# Patient Record
Sex: Male | Born: 1960 | Hispanic: Yes | Marital: Married | Smoking: Never smoker
Health system: Southern US, Community
[De-identification: ages and names within clinical notes are randomized; demographics above are authoritative.]

## PROBLEM LIST (undated history)

## (undated) DIAGNOSIS — E119 Type 2 diabetes mellitus without complications: Secondary | ICD-10-CM

---

## 2021-02-01 ENCOUNTER — Encounter (HOSPITAL_COMMUNITY): Payer: Self-pay | Admitting: Family Medicine

## 2021-02-01 ENCOUNTER — Inpatient Hospital Stay (HOSPITAL_COMMUNITY): Payer: Managed Care, Other (non HMO)

## 2021-02-01 ENCOUNTER — Inpatient Hospital Stay (HOSPITAL_COMMUNITY)
Admission: AD | Admit: 2021-02-01 | Discharge: 2021-02-08 | DRG: 441 | Disposition: A | Discharge status: Home Health | Payer: Managed Care, Other (non HMO) | Source: Other Acute Inpatient Hospital | Attending: Internal Medicine | Admitting: Internal Medicine

## 2021-02-01 DIAGNOSIS — R188 Other ascites: Secondary | ICD-10-CM | POA: Diagnosis present

## 2021-02-01 DIAGNOSIS — Z79899 Other long term (current) drug therapy: Secondary | ICD-10-CM | POA: Diagnosis not present

## 2021-02-01 DIAGNOSIS — R7881 Bacteremia: Secondary | ICD-10-CM | POA: Diagnosis present

## 2021-02-01 DIAGNOSIS — B9620 Unspecified Escherichia coli [E. coli] as the cause of diseases classified elsewhere: Secondary | ICD-10-CM | POA: Diagnosis present

## 2021-02-01 DIAGNOSIS — B9689 Other specified bacterial agents as the cause of diseases classified elsewhere: Secondary | ICD-10-CM | POA: Diagnosis not present

## 2021-02-01 DIAGNOSIS — E1165 Type 2 diabetes mellitus with hyperglycemia: Secondary | ICD-10-CM | POA: Diagnosis present

## 2021-02-01 DIAGNOSIS — E8809 Other disorders of plasma-protein metabolism, not elsewhere classified: Secondary | ICD-10-CM | POA: Diagnosis present

## 2021-02-01 DIAGNOSIS — Z95828 Presence of other vascular implants and grafts: Secondary | ICD-10-CM

## 2021-02-01 DIAGNOSIS — A419 Sepsis, unspecified organism: Secondary | ICD-10-CM | POA: Diagnosis present

## 2021-02-01 DIAGNOSIS — D649 Anemia, unspecified: Secondary | ICD-10-CM | POA: Diagnosis present

## 2021-02-01 DIAGNOSIS — R6520 Severe sepsis without septic shock: Secondary | ICD-10-CM | POA: Diagnosis present

## 2021-02-01 DIAGNOSIS — Z7984 Long term (current) use of oral hypoglycemic drugs: Secondary | ICD-10-CM | POA: Diagnosis not present

## 2021-02-01 DIAGNOSIS — Z833 Family history of diabetes mellitus: Secondary | ICD-10-CM | POA: Diagnosis not present

## 2021-02-01 DIAGNOSIS — K750 Abscess of liver: Principal | ICD-10-CM | POA: Diagnosis present

## 2021-02-01 DIAGNOSIS — R197 Diarrhea, unspecified: Secondary | ICD-10-CM | POA: Diagnosis present

## 2021-02-01 DIAGNOSIS — K651 Peritoneal abscess: Secondary | ICD-10-CM | POA: Diagnosis present

## 2021-02-01 DIAGNOSIS — N179 Acute kidney failure, unspecified: Secondary | ICD-10-CM | POA: Diagnosis present

## 2021-02-01 DIAGNOSIS — B966 Bacteroides fragilis [B. fragilis] as the cause of diseases classified elsewhere: Secondary | ICD-10-CM | POA: Diagnosis present

## 2021-02-01 DIAGNOSIS — E119 Type 2 diabetes mellitus without complications: Secondary | ICD-10-CM | POA: Diagnosis not present

## 2021-02-01 DIAGNOSIS — E876 Hypokalemia: Secondary | ICD-10-CM | POA: Diagnosis present

## 2021-02-01 DIAGNOSIS — Z789 Other specified health status: Secondary | ICD-10-CM

## 2021-02-01 DIAGNOSIS — Z978 Presence of other specified devices: Secondary | ICD-10-CM | POA: Diagnosis not present

## 2021-02-01 DIAGNOSIS — R9431 Abnormal electrocardiogram [ECG] [EKG]: Secondary | ICD-10-CM | POA: Diagnosis not present

## 2021-02-01 HISTORY — DX: Type 2 diabetes mellitus without complications: E11.9

## 2021-02-01 LAB — GLUCOSE, CAPILLARY
Glucose-Capillary: 131 mg/dL — ABNORMAL HIGH (ref 70–99)
Glucose-Capillary: 173 mg/dL — ABNORMAL HIGH (ref 70–99)

## 2021-02-01 MED ORDER — OXYCODONE HCL 5 MG PO TABS
5.0000 mg | ORAL_TABLET | ORAL | Status: DC | PRN
  Administered 2021-02-01 – 2021-02-08 (×17): 5 mg via ORAL
  Filled 2021-02-01 (×17): qty 1

## 2021-02-01 MED ORDER — SODIUM CHLORIDE 0.9% FLUSH
10.0000 mL | INTRAVENOUS | Status: DC | PRN
  Administered 2021-02-07: 10 mL

## 2021-02-01 MED ORDER — ONDANSETRON HCL 4 MG/2ML IJ SOLN: 4.0000 mg | Freq: Four times a day (QID) | INTRAMUSCULAR | Status: DC | PRN

## 2021-02-01 MED ORDER — ACETAMINOPHEN 650 MG RE SUPP: 650.0000 mg | Freq: Four times a day (QID) | RECTAL | Status: DC | PRN

## 2021-02-01 MED ORDER — SODIUM CHLORIDE 0.9 % IV SOLN
1.0000 g | Freq: Three times a day (TID) | INTRAVENOUS | Status: DC
  Administered 2021-02-01 – 2021-02-06 (×15): 1 g via INTRAVENOUS
  Filled 2021-02-01 (×17): qty 1

## 2021-02-01 MED ORDER — POLYETHYLENE GLYCOL 3350 17 G PO PACK: 17.0000 g | PACK | Freq: Every day | ORAL | Status: DC | PRN

## 2021-02-01 MED ORDER — SODIUM CHLORIDE 0.9% FLUSH
10.0000 mL | Freq: Two times a day (BID) | INTRAVENOUS | Status: DC
  Administered 2021-02-01 – 2021-02-02 (×2): 10 mL
  Administered 2021-02-02 – 2021-02-03 (×2): 30 mL
  Administered 2021-02-03 – 2021-02-08 (×10): 10 mL

## 2021-02-01 MED ORDER — ONDANSETRON HCL 4 MG PO TABS: 4.0000 mg | ORAL_TABLET | Freq: Four times a day (QID) | ORAL | Status: DC | PRN

## 2021-02-01 MED ORDER — INSULIN ASPART 100 UNIT/ML ~~LOC~~ SOLN
0.0000 [IU] | SUBCUTANEOUS | Status: DC
  Administered 2021-02-01: 3 [IU] via SUBCUTANEOUS
  Administered 2021-02-02: 5 [IU] via SUBCUTANEOUS
  Administered 2021-02-02: 3 [IU] via SUBCUTANEOUS
  Administered 2021-02-02 (×2): 2 [IU] via SUBCUTANEOUS
  Administered 2021-02-03 (×2): 3 [IU] via SUBCUTANEOUS
  Administered 2021-02-03: 5 [IU] via SUBCUTANEOUS
  Administered 2021-02-03 – 2021-02-04 (×4): 3 [IU] via SUBCUTANEOUS
  Administered 2021-02-04: 2 [IU] via SUBCUTANEOUS
  Administered 2021-02-04 (×3): 3 [IU] via SUBCUTANEOUS
  Administered 2021-02-05 (×3): 2 [IU] via SUBCUTANEOUS
  Administered 2021-02-05: 5 [IU] via SUBCUTANEOUS
  Administered 2021-02-05 (×2): 2 [IU] via SUBCUTANEOUS
  Administered 2021-02-06: 3 [IU] via SUBCUTANEOUS
  Administered 2021-02-06 – 2021-02-07 (×5): 2 [IU] via SUBCUTANEOUS
  Administered 2021-02-07: 3 [IU] via SUBCUTANEOUS
  Administered 2021-02-07 – 2021-02-08 (×3): 2 [IU] via SUBCUTANEOUS

## 2021-02-01 MED ORDER — HYDROMORPHONE HCL 1 MG/ML IJ SOLN
0.5000 mg | INTRAMUSCULAR | Status: DC | PRN
  Administered 2021-02-02 – 2021-02-04 (×4): 0.5 mg via INTRAVENOUS
  Filled 2021-02-01 (×4): qty 1

## 2021-02-01 MED ORDER — CHLORHEXIDINE GLUCONATE CLOTH 2 % EX PADS
6.0000 | MEDICATED_PAD | Freq: Every day | CUTANEOUS | Status: DC
  Administered 2021-02-03 – 2021-02-08 (×5): 6 via TOPICAL

## 2021-02-01 MED ORDER — INSULIN GLARGINE 100 UNIT/ML ~~LOC~~ SOLN
5.0000 [IU] | Freq: Every day | SUBCUTANEOUS | Status: DC
  Administered 2021-02-01 – 2021-02-02 (×2): 5 [IU] via SUBCUTANEOUS
  Filled 2021-02-01 (×3): qty 0.05

## 2021-02-01 MED ORDER — ACETAMINOPHEN 325 MG PO TABS: 650.0000 mg | ORAL_TABLET | Freq: Four times a day (QID) | ORAL | Status: DC | PRN

## 2021-02-01 MED ORDER — SODIUM CHLORIDE 0.9 % IV SOLN: INTRAVENOUS | Status: AC

## 2021-02-01 NOTE — H&P (Signed)
History and Physical    Maurice Hodges KPT:[id number] DOB: 03-15-1961 DOA: 02/01/2021  PCP: [facility]   Patient coming from: [facility], had been at home prior   Chief Complaint: Abdominal pain, chills   HPI: Maurice Hodges is a 60 y.o. male with medical history significant for type 2 diabetes mellitus, presented to urgent care on 01/26/2020 after he developed chills and abdominal discomfort, was found to have glucose greater than 600, and was directed to the ED.  He had been taking metformin only for diabetes, had been experiencing some abdominal discomfort that he described as mild and generalized, but became concerned when he developed chills and some fatigue.  [facility] Course: Upon arrival to the ED on 01/25/2021, patient was found to be afebrile, hemodynamically stable initially but later developed systolic pressures in the 70s and 80s that responded to IV fluid boluses.  He had serum glucose of 706, creatinine 1.40, albumin 2.0, AST 188, and ALT 200 with normal bilirubin.  CT revealed large hepatic abscess.  Percutaneous drain was placed by IR on 01/27/2021.  Cultures from the abscess grew E. coli and Bacteroides fragilis.  Blood culture grew Bacteroides.  He had been on Zosyn, developed tubular hepatic abscesses, and was reportedly switched over to meropenem prior to transfer to [facility].  Review of Systems:  All other systems reviewed and apart from HPI, are negative.  Past Medical History:  Diagnosis Date  . Diabetes mellitus without complication (HCC)     History reviewed. No pertinent surgical history.  Social History:   reports that he has never smoked. He has never used smokeless tobacco. He reports previous alcohol use. He reports that he does not use drugs.  Not on File  Family History  Problem Relation Age of Onset  . Diabetes Other   . Heart attack Neg Hx   . Stroke Neg Hx      Prior to Admission medications   Not on File     Physical Exam: Vitals:   02/01/21 1848 02/01/21 2036  BP: 130/82 134/82  Pulse: (!) 104 (!) 103  Resp:  20  Temp: 98.8 F (37.1 C) 98.5 F (36.9 C)  TempSrc: Oral Oral  SpO2: 97% 96%    Constitutional: NAD, calm  Eyes: PERTLA, lids and conjunctivae normal ENMT: Mucous membranes are moist. Posterior pharynx clear of any exudate or lesions.   Neck: supple, no masses  Respiratory:  no wheezing, no crackles. No accessory muscle use.  Cardiovascular: S1 & S2 heard, regular rate and rhythm. Pitting edema lower legs bilaterally.   Abdomen: soft, tender in mid abdomen and RUQ without rebound tenderness or guarding. Bowel sounds active.  Musculoskeletal: no clubbing / cyanosis. No joint deformity upper and lower extremities.   Skin: no significant rashes, lesions, ulcers. Warm, dry, well-perfused. Neurologic: CN 2-12 grossly intact. Sensation intact. Moving all extremities.  Psychiatric: Alert and oriented to person, place, and situation. Very pleasant and cooperative.    Labs and Imaging on Admission: I have personally reviewed following labs and imaging studies  CBC: No results for input(s): WBC, NEUTROABS, HGB, HCT, MCV, PLT in the last 168 hours. Basic Metabolic Panel: No results for input(s): NA, K, CL, CO2, GLUCOSE, BUN, CREATININE, CALCIUM, MG, PHOS in the last 168 hours. GFR: CrCl cannot be calculated (No successful lab value found.). Liver Function Tests: No results for input(s): AST, ALT, ALKPHOS, BILITOT, PROT, ALBUMIN in the last 168 hours. No results for input(s): LIPASE, AMYLASE in the  last 168 hours. No results for input(s): AMMONIA in the last 168 hours. Coagulation Profile: No results for input(s): INR, PROTIME in the last 168 hours. Cardiac Enzymes: No results for input(s): CKTOTAL, CKMB, CKMBINDEX, TROPONINI in the last 168 hours. BNP (last 3 results) No results for input(s): PROBNP in the last 8760 hours. HbA1C: No results for input(s): HGBA1C in the last  72 hours. CBG: Recent Labs  Lab 02/01/21 1852  GLUCAP 173*   Lipid Profile: No results for input(s): CHOL, HDL, LDLCALC, TRIG, CHOLHDL, LDLDIRECT in the last 72 hours. Thyroid Function Tests: No results for input(s): TSH, T4TOTAL, FREET4, T3FREE, THYROIDAB in the last 72 hours. Anemia Panel: No results for input(s): VITAMINB12, FOLATE, FERRITIN, TIBC, IRON, RETICCTPCT in the last 72 hours. Urine analysis: No results found for: COLORURINE, APPEARANCEUR, LABSPEC, PHURINE, GLUCOSEU, HGBUR, BILIRUBINUR, KETONESUR, PROTEINUR, UROBILINOGEN, NITRITE, LEUKOCYTESUR Sepsis Labs: @LABRCNTIP (procalcitonin:4,lacticidven:4) )No results found for this or any previous visit (from the past 240 hour(s)).   Radiological Exams on Admission: No results found.   Assessment/Plan   1. Liver abscesses; bacteremia   - Admitted to [facility] 4/13 with sepsis secondary to liver abscess, percutaneous drain placed 4/15, blood culture growing Bacteroides fragilis, was on Zosyn, developed 2 more liver abscesses, now on meropenem and transferred to [facility]  - Sepsis appears to have resolved  - Continue meropenem, consult IR for percutaneous drainage of new abscesses, follow cultures and clinical course    2. Type II DM  - A1c was 14.0% on 4/13, had been on metformin only prior to admission to [facility] where he has been managed with Lantus and Humalog  - Continue CBG checks, Lantus, Novolog sliding-scale    3. Anemia  - Hgb 9.4 on day of transfer (4/20), had been 10.8 on admission (4/13)  - No overt bleeding  - Monitor, transfuse if needed     4. Hypokalemia  - Potassium 3.4, was being replaced at [facility]   - Repeat chem panel in am    5. Severe sepsis; AKI  - Appears to have resolved prior to transfer  - Had SCr 1.4 on admission to [facility], down to 0.59 today - Continue antibiotics, repeat labs    DVT prophylaxis: SCDs Code Status: Full  Level of Care: Level of care: Med-Surg Family Communication: Brother updated  at bedside Disposition Plan:  Patient is from: Home  Anticipated d/c is to: Home  Anticipated d/c date is: 02/05/21 Patient currently: Pending IR eval for drain placement, culture sensitivities  Consults called: None Admission status: Inpatient     02/07/21, MD [facility]  02/01/2021, 8:52 PM

## 2021-02-01 NOTE — Progress Notes (Signed)
Pt on unit.  CBG 173.  Pain 8/10.  Wears glasses. No missing teeth, dentures or partials.  PICC triple lumen, right arm  No skin issues.  Last BM 02/01/2021. Diarrhea. Negative for C. Diff  Pulses moderate in all extremities.  Abdomen distended, RUQ pain and pain along crease under umbilicus.  Pt has been NPO since right after breakfast 04/20 d/t not knowing the plan for when he arrived here at [facility].  Messaged Dr. Katrinka Blazing for orders for pain medication.

## 2021-02-02 ENCOUNTER — Inpatient Hospital Stay (HOSPITAL_COMMUNITY): Payer: Managed Care, Other (non HMO)

## 2021-02-02 DIAGNOSIS — N179 Acute kidney failure, unspecified: Secondary | ICD-10-CM

## 2021-02-02 DIAGNOSIS — Z978 Presence of other specified devices: Secondary | ICD-10-CM

## 2021-02-02 DIAGNOSIS — A419 Sepsis, unspecified organism: Secondary | ICD-10-CM

## 2021-02-02 DIAGNOSIS — E876 Hypokalemia: Secondary | ICD-10-CM

## 2021-02-02 DIAGNOSIS — R6520 Severe sepsis without septic shock: Secondary | ICD-10-CM

## 2021-02-02 DIAGNOSIS — D649 Anemia, unspecified: Secondary | ICD-10-CM

## 2021-02-02 DIAGNOSIS — B9689 Other specified bacterial agents as the cause of diseases classified elsewhere: Secondary | ICD-10-CM

## 2021-02-02 DIAGNOSIS — B9620 Unspecified Escherichia coli [E. coli] as the cause of diseases classified elsewhere: Secondary | ICD-10-CM

## 2021-02-02 DIAGNOSIS — K750 Abscess of liver: Principal | ICD-10-CM

## 2021-02-02 LAB — CBC WITH DIFFERENTIAL/PLATELET
Abs Immature Granulocytes: 0.19 10*3/uL — ABNORMAL HIGH (ref 0.00–0.07)
Basophils Absolute: 0 10*3/uL (ref 0.0–0.1)
Basophils Relative: 0 %
Eosinophils Absolute: 0.1 10*3/uL (ref 0.0–0.5)
Eosinophils Relative: 1 %
HCT: 30 % — ABNORMAL LOW (ref 39.0–52.0)
Hemoglobin: 9.6 g/dL — ABNORMAL LOW (ref 13.0–17.0)
Immature Granulocytes: 2 %
Lymphocytes Relative: 10 %
Lymphs Abs: 1.2 10*3/uL (ref 0.7–4.0)
MCH: 27.3 pg (ref 26.0–34.0)
MCHC: 32 g/dL (ref 30.0–36.0)
MCV: 85.2 fL (ref 80.0–100.0)
Monocytes Absolute: 0.7 10*3/uL (ref 0.1–1.0)
Monocytes Relative: 6 %
Neutro Abs: 9.9 10*3/uL — ABNORMAL HIGH (ref 1.7–7.7)
Neutrophils Relative %: 81 %
Platelets: 466 10*3/uL — ABNORMAL HIGH (ref 150–400)
RBC: 3.52 MIL/uL — ABNORMAL LOW (ref 4.22–5.81)
RDW: 14.6 % (ref 11.5–15.5)
WBC: 12 10*3/uL — ABNORMAL HIGH (ref 4.0–10.5)
nRBC: 0 % (ref 0.0–0.2)

## 2021-02-02 LAB — COMPREHENSIVE METABOLIC PANEL
ALT: 23 U/L (ref 0–44)
AST: 16 U/L (ref 15–41)
Albumin: 1.2 g/dL — ABNORMAL LOW (ref 3.5–5.0)
Alkaline Phosphatase: 94 U/L (ref 38–126)
Anion gap: 7 (ref 5–15)
BUN: 6 mg/dL (ref 6–20)
CO2: 27 mmol/L (ref 22–32)
Calcium: 7.6 mg/dL — ABNORMAL LOW (ref 8.9–10.3)
Chloride: 99 mmol/L (ref 98–111)
Creatinine, Ser: 0.57 mg/dL — ABNORMAL LOW (ref 0.61–1.24)
GFR, Estimated: >60 mL/min (ref >60)
Glucose, Bld: 122 mg/dL — ABNORMAL HIGH (ref 70–99)
Potassium: 3.1 mmol/L — ABNORMAL LOW (ref 3.5–5.1)
Sodium: 133 mmol/L — ABNORMAL LOW (ref 135–145)
Total Bilirubin: 0.6 mg/dL (ref 0.3–1.2)
Total Protein: 5.1 g/dL — ABNORMAL LOW (ref 6.5–8.1)

## 2021-02-02 LAB — HIV ANTIBODY (ROUTINE TESTING W REFLEX): HIV Screen 4th Generation wRfx: NONREACTIVE

## 2021-02-02 LAB — GLUCOSE, CAPILLARY
Glucose-Capillary: 111 mg/dL — ABNORMAL HIGH (ref 70–99)
Glucose-Capillary: 127 mg/dL — ABNORMAL HIGH (ref 70–99)
Glucose-Capillary: 137 mg/dL — ABNORMAL HIGH (ref 70–99)
Glucose-Capillary: 172 mg/dL — ABNORMAL HIGH (ref 70–99)
Glucose-Capillary: 194 mg/dL — ABNORMAL HIGH (ref 70–99)
Glucose-Capillary: 227 mg/dL — ABNORMAL HIGH (ref 70–99)

## 2021-02-02 MED ORDER — POTASSIUM CHLORIDE CRYS ER 20 MEQ PO TBCR
20.0000 meq | EXTENDED_RELEASE_TABLET | Freq: Once | ORAL | Status: AC
  Administered 2021-02-02: 20 meq via ORAL
  Filled 2021-02-02: qty 1

## 2021-02-02 MED ORDER — MIDAZOLAM HCL 2 MG/2ML IJ SOLN
INTRAMUSCULAR | Status: AC | PRN
  Administered 2021-02-02 (×4): 1 mg via INTRAVENOUS

## 2021-02-02 MED ORDER — SODIUM CHLORIDE 0.9% FLUSH
5.0000 mL | Freq: Three times a day (TID) | INTRAVENOUS | Status: DC
  Administered 2021-02-02 – 2021-02-08 (×18): 5 mL

## 2021-02-02 MED ORDER — MIDAZOLAM HCL 2 MG/2ML IJ SOLN
INTRAMUSCULAR | Status: AC
  Filled 2021-02-02: qty 8

## 2021-02-02 MED ORDER — MAGNESIUM SULFATE IN D5W 1-5 GM/100ML-% IV SOLN
1.0000 g | Freq: Once | INTRAVENOUS | Status: AC
  Administered 2021-02-02: 1 g via INTRAVENOUS
  Filled 2021-02-02: qty 100

## 2021-02-02 MED ORDER — FENTANYL CITRATE (PF) 100 MCG/2ML IJ SOLN
INTRAMUSCULAR | Status: AC
  Filled 2021-02-02: qty 4

## 2021-02-02 MED ORDER — LIDOCAINE HCL 1 % IJ SOLN
INTRAMUSCULAR | Status: AC
  Filled 2021-02-02: qty 40

## 2021-02-02 MED ORDER — FENTANYL CITRATE (PF) 100 MCG/2ML IJ SOLN
INTRAMUSCULAR | Status: AC | PRN
  Administered 2021-02-02 (×4): 50 ug via INTRAVENOUS

## 2021-02-02 MED ORDER — POTASSIUM CHLORIDE 10 MEQ/100ML IV SOLN
10.0000 meq | INTRAVENOUS | Status: AC
  Administered 2021-02-02 (×2): 10 meq via INTRAVENOUS
  Filled 2021-02-02 (×2): qty 100

## 2021-02-02 NOTE — Procedures (Signed)
Interventional Radiology Procedure:   Indications: Liver abscess with new intra-abdominal fluid collections  Procedure: CT guided placement of 2 drains in right abdomen  Findings: 1) Loculated collection around inferior right hepatic lobe, placed 10 Fr drain and removed 50 ml of cloudy yellow fluid. 2) Large fluid collection in lower abdomen that connects with pelvic collection.  325 ml of cloudy yellow fluid removed and 10 Fr drain placed 3) Existing right hepatic drain with residual low density areas in liver  Complications: None     EBL: less than 10 ml  Plan: Follow output.  Sent fluid for culture. Recommend CT AP with IV contrast in 2-3 days to evaluate liver and abdominal collections.    Maurice Hodges R. Lowella Dandy, MD  Pager: [phone]

## 2021-02-02 NOTE — Consult Note (Signed)
Date of Admission:  02/01/2021          Reason for Consult: Liver abscesses with bacteremia with Bacteroides fragilis   Referring Provider: Dr. Pola Corn   Assessment:  1. Polymicrobial liver abscesses status post placement of drain at [facility] complicated by 2. Bacteroides bacteremia 3. Septic physiology when his drain was closed shut and now found to have new liver abscesses status post 4. IR guided placement of 2 new drains into hepatic abscesses 5. Left peripheral IV not functioning properly  Plan:  1. Continue meropenem for now 2. Follow-up cultures from [facility] which apparently have been sent to the [facility] (per Sharin Mons our Pharm D) 3. Follow-up our own cultures 4. He will need a protracted course of antibiotics and hence the importance of following up with susceptibility data it would be nicer to eventually build to transition him to oral therapy in a rational way. 5. Screen for viral hepatitides and HIV if not done so yet 6. Would DC left peripheral IV  Principal Problem:   Bacterial liver abscess Active Problems:   Uncontrolled type II diabetes mellitus (HCC)   Hypokalemia   AKI (acute kidney injury) (HCC)   Severe sepsis (HCC)   Normocytic anemia   Liver abscess due to bacteria   Scheduled Meds: . Chlorhexidine Gluconate Cloth  6 each Topical Daily  . insulin aspart  0-15 Units Subcutaneous Q4H  . insulin glargine  5 Units Subcutaneous QHS  . lidocaine      . sodium chloride flush  10-40 mL Intracatheter Q12H   Continuous Infusions: . meropenem (MERREM) IV 1 g (02/02/21 1416)   PRN Meds:.acetaminophen **OR** acetaminophen, HYDROmorphone (DILAUDID) injection, ondansetron **OR** ondansetron (ZOFRAN) IV, oxyCODONE, polyethylene glycol, sodium chloride flush  HPI: Maurice Hodges is a 60 y.o. male history of diabetes mellitus who has been feeling poorly going back to the end of March.  He was having fevers chills and malaise.  He was  seen at urgent care at [facility] who diagnosed him with having strep throat based on a rapid strep antigen test.  Note the documentation in the note though Ebony Cargo states that his oropharynx was clear.  The patient self tell me that his tongue had developed a white coating he remembers.  He was prescribed Augmentin.  In the interim he worsened and came back to the urgent care on the 13th with severe abdominal discomfort.  He had quite elevated transaminases as well as a blood sugar greater than 600.  He was referred to the urgent care at [facility] to the emergency department.  In the ER he is initially hemodynamically stable but then became hypotensive.  He was given fluid boluses.  CT scan showed a large hepatic abscess.  He underwent percutaneous drainage on 15 April 12/04/2020:  Cultures in the abscess grew E. coli and Bacteroides fragilis.  The E. coli apparently is been sent to the [facility] which is quite baffling to me.  He had been on Zosyn there.  The patient himself tells me that the drain became shot due to a switch with locking system on his tubing that he showed me.  When it was turned to shot of course it did not drain anymore and he became systemically ill with drenching sweats and fevers.  Apparently the following day one of the physicians noticed this and reopened his drain which immediately filled up with purulent material.  In the interim he was switched over to meropenem.  He has developed additional intra-abdominal abscesses and has been transferred to [facility] for further evaluation and management.  Interventional radiology of seeing the patient and placed 2 drain 1 into a loculated collection around the inferior right hepatic lobe which yielded 50 mL of cloudy fluid on aspiration and a second 1 in the lower abdomen that the clip collects with a pelvic collection of fluid with 325 mL aspirated and drain placed.  Cultures have been sent by radiology as  well.  The patient himself denies ever having had trouble with his gallbladder.  He has never had appendicitis or diverticulitis.  He is eager to have water and also to eat.  He had a PICC line in place which is visible on the right arm.  He is sister comments that it was placed last Thursday.  He should have cleared his Bacteroides on Zosyn and that by that time.           Review of Systems: Review of Systems  Constitutional: Positive for chills, diaphoresis, fever and malaise/fatigue. Negative for weight loss.  HENT: Positive for sore throat. Negative for congestion.   Eyes: Negative for blurred vision and photophobia.  Respiratory: Negative for cough, shortness of breath and wheezing.   Cardiovascular: Negative for chest pain, palpitations and leg swelling.  Gastrointestinal: Positive for abdominal pain, diarrhea, nausea and vomiting. Negative for blood in stool, constipation, heartburn and melena.  Genitourinary: Negative for dysuria, flank pain and hematuria.  Musculoskeletal: Positive for myalgias. Negative for back pain, falls and joint pain.  Skin: Negative for itching and rash.  Neurological: Negative for dizziness, focal weakness, loss of consciousness, weakness and headaches.  Endo/Heme/Allergies: Does not bruise/bleed easily.  Psychiatric/Behavioral: Negative for depression and suicidal ideas. The patient does not have insomnia.     Past Medical History:  Diagnosis Date  . Diabetes mellitus without complication (HCC)     Social History   Tobacco Use  . Smoking status: Never Smoker  . Smokeless tobacco: Never Used  Substance Use Topics  . Alcohol use: Not Currently  . Drug use: Never    Family History  Problem Relation Age of Onset  . Diabetes Other   . Heart attack Neg Hx   . Stroke Neg Hx    No Known Allergies  OBJECTIVE: Blood pressure 114/89, pulse (!) 102, temperature 98 F (36.7 C), temperature source Oral, resp. rate 17, SpO2 98  %.  Physical Exam Constitutional:      Appearance: He is well-developed.  HENT:     Head: Normocephalic and atraumatic.  Eyes:     Conjunctiva/sclera: Conjunctivae normal.  Cardiovascular:     Rate and Rhythm: Regular rhythm. Tachycardia present.     Heart sounds: No murmur heard. No friction rub. No gallop.   Pulmonary:     Effort: Pulmonary effort is normal. No respiratory distress.     Breath sounds: No stridor. No wheezing or rhonchi.  Abdominal:     General: There is distension.     Palpations: Abdomen is soft.     Tenderness: There is abdominal tenderness.    Musculoskeletal:        General: No tenderness. Normal range of motion.     Cervical back: Normal range of motion and neck supple.  Skin:    General: Skin is warm and dry.     Coloration: Skin is not pale.     Findings: No erythema or rash.  Neurological:     General: No focal deficit present.  Mental Status: He is alert and oriented to person, place, and time.  Psychiatric:        Mood and Affect: Mood normal.        Behavior: Behavior normal.        Thought Content: Thought content normal.        Judgment: Judgment normal.     Lab Results Lab Results  Component Value Date   WBC 12.0 (H) 02/02/2021   HGB 9.6 (L) 02/02/2021   HCT 30.0 (L) 02/02/2021   MCV 85.2 02/02/2021   PLT 466 (H) 02/02/2021    Lab Results  Component Value Date   CREATININE 0.57 (L) 02/02/2021   BUN 6 02/02/2021   NA 133 (L) 02/02/2021   K 3.1 (L) 02/02/2021   CL 99 02/02/2021   CO2 27 02/02/2021    Lab Results  Component Value Date   ALT 23 02/02/2021   AST 16 02/02/2021   ALKPHOS 94 02/02/2021   BILITOT 0.6 02/02/2021     Microbiology: No results found for this or any previous visit (from the past 240 hour(s)).  Acey Lav, MD [facility] for Infectious Disease [facility] [phone] pager  02/02/2021, 2:16 PM

## 2021-02-02 NOTE — Sedation Documentation (Addendum)
50cc turbid straw colored fluid drained from drain #2. 325cc turbid straw colored fluid drained from drain #3.

## 2021-02-02 NOTE — Progress Notes (Signed)
PROGRESS NOTE  Maurice Hodges  DOB: 10-22-1960  PCP: [facility] UMP:[id number]  DOA: 02/01/2021  LOS: 1 day   Chief Complaint: Abdominal pain, chills   Brief narrative: Maurice Hodges is a 60 y.o. male with PMH significant for DM2 on metformin.   4/13, patient presented to urgent care for some chills and abdominal discomfort, he was noted to have blood sugar level greater than 600 and directed to ED at [facility]..   In the ED, patient was found to be afebrile, hemodynamically stable. CT abdomen showed large hepatic abscess for which he had a percutaneous drain placed by IR on 4/15. Cultures from the abscess grew E. coli and Bacteroides fragilis.  Blood culture grew Bacteroides. He had been on IV Zosyn despite which he developed 2 more liver abscesses, and was switched over to meropenem. He was then transferred to [facility] on 4/20  Subjective: Patient was seen and examined this morning.  Middle-aged male.  Not in distress.  No new symptoms.  Family is at bedside. Chart reviewed Tachycardic to 100s Labs this morning with sodium level low at 133, potassium low at 3.1, albumin level significantly low at 1.2, WBC elevated to 12, hemoglobin low at 9.6  Assessment/Plan: Hepatic abscess -E. coli and bacteroid fragilis Bacteremia with bacteroids fragilis -Initially hospitalized at [facility] (4/13-4/20) with sepsis.  -Transferred to [facility] after abscess did not get better with percutaneous drainage and IV antibiotics. Sepsis has resolved by the time of transfer. -Currently on IV meropenem. -IR consulted.  Noted the plan for additional drain placement. -We will get ID consulted as well per  Type 2 diabetes mellitus -A1c 14 on 4/13 -Was only on metformin 1000 mg twice daily at home. -Currently Lantus 5 units at bedtime/sliding scale insulin. -Blood sugar trending mostly under 150. Recent Labs  Lab 02/01/21 1852 02/01/21 2354 02/02/21 0347  02/02/21 0748  GLUCAP 173* 131* 127* 111*   Anemia  -Hemoglobin was reportedly at 10.8 on 4/13, lower at 9.6 on 4/21.  Anemia panel ordered for tomorrow. Recent Labs    02/02/21 0417  HGB 9.6*  MCV 85.2   Hypokalemia  -Potassium low at 3.1 this morning.  Replacement ordered. -Repeat tomorrow.  Check magnesium and phosphorus level as well. Potassium 3.4, was being replaced at [facility]   - Repeat chem panel in am   Recent Labs  Lab 02/02/21 0417  K 3.1*   Mobility: Increase ambulation Code Status:   Code Status: Full Code  Nutritional status: There is no height or weight on file to calculate BMI.     Diet Order            Diet NPO time specified Except for: Ice Chips, Sips with Meds  Diet effective midnight               Diabetic diet postprocedure  DVT prophylaxis: SCDs Start: 02/01/21 2000   Antimicrobials:  IV meropenem Fluid: None Consultants: IR, ID Family Communication:  Family at bedside  Status is: Inpatient  Remains inpatient appropriate because: Needs IV antibiotics, procedure, inpatient monitoring  Dispo: The patient is from: Home              Anticipated d/c is to: Home in more than 3 days              Patient currently is not medically stable to d/c.   Difficult to place patient No   Infusions:  . meropenem (MERREM) IV 1 g (02/02/21 4008)  Scheduled Meds: . Chlorhexidine Gluconate Cloth  6 each Topical Daily  . insulin aspart  0-15 Units Subcutaneous Q4H  . insulin glargine  5 Units Subcutaneous QHS  . lidocaine      . sodium chloride flush  10-40 mL Intracatheter Q12H    Antimicrobials: Anti-infectives (From admission, onward)   Start     Dose/Rate Route Frequency Ordered Stop   02/01/21 2200  meropenem (MERREM) 1 g in sodium chloride 0.9 % 100 mL IVPB        1 g 200 mL/hr over 30 Minutes Intravenous Every 8 hours 02/01/21 2004        PRN meds: acetaminophen **OR** acetaminophen, HYDROmorphone (DILAUDID) injection, ondansetron  **OR** ondansetron (ZOFRAN) IV, oxyCODONE, polyethylene glycol, sodium chloride flush   Objective: Vitals:   02/02/21 1245 02/02/21 1312  BP: 124/85 114/89  Pulse: (!) 104 (!) 102  Resp: 13 17  Temp:  98 F (36.7 C)  SpO2: 96% 98%    Intake/Output Summary (Last 24 hours) at 02/02/2021 1315 Last data filed at 02/02/2021 0302 Gross per 24 hour  Intake 421.24 ml  Output --  Net 421.24 ml   There were no vitals filed for this visit. Weight change:  There is no height or weight on file to calculate BMI.   Physical Exam: General exam: Pleasant, middle-aged male.  Not in distress Skin: No rashes, lesions or ulcers. HEENT: Atraumatic, normocephalic, no obvious bleeding Lungs: Clear to auscultation bilaterally CVS: Regular rate and rhythm, no murmur GI/Abd soft, tenderness present right upper quadrant, bowel sound present CNS: Alert, awake, oriented x3 Psychiatry: Mood appropriate Extremities: No pedal edema, no calf tenderness  Data Review: I have personally reviewed the laboratory data and studies available.  Recent Labs  Lab 02/02/21 0417  WBC 12.0*  NEUTROABS 9.9*  HGB 9.6*  HCT 30.0*  MCV 85.2  PLT 466*   Recent Labs  Lab 02/02/21 0417  NA 133*  K 3.1*  CL 99  CO2 27  GLUCOSE 122*  BUN 6  CREATININE 0.57*  CALCIUM 7.6*    F/u labs ordered Unresulted Labs (From admission, onward)          Start     Ordered   02/03/21 0500  Magnesium  Tomorrow morning,   R       Question:  Specimen collection method  Answer:  IV Team=IV Team collect   02/02/21 0618   02/03/21 0500  Vitamin B12  (Anemia Panel (PNL))  Tomorrow morning,   R       Question:  Specimen collection method  Answer:  IV Team=IV Team collect   02/02/21 1105   02/03/21 0500  Folate  (Anemia Panel (PNL))  Tomorrow morning,   R       Question:  Specimen collection method  Answer:  IV Team=IV Team collect   02/02/21 1105   02/03/21 0500  Iron and TIBC  (Anemia Panel (PNL))  Tomorrow morning,   R        Question:  Specimen collection method  Answer:  IV Team=IV Team collect   02/02/21 1105   02/03/21 0500  Ferritin  (Anemia Panel (PNL))  Tomorrow morning,   R       Question:  Specimen collection method  Answer:  IV Team=IV Team collect   02/02/21 1105   02/03/21 0500  Reticulocytes  (Anemia Panel (PNL))  Tomorrow morning,   R       Question:  Specimen collection method  Answer:  IV  Team=IV Team collect   02/02/21 1105   02/03/21 0500  Phosphorus  Tomorrow morning,   R       Question:  Specimen collection method  Answer:  IV Team=IV Team collect   02/02/21 1106   02/02/21 1243  Aerobic/Anaerobic Culture (surgical/deep wound)  Once,   R        02/02/21 1242   02/02/21 0500  Comprehensive metabolic panel  Daily,   R      02/01/21 2004   02/02/21 0500  CBC WITH DIFFERENTIAL  Daily,   R      02/01/21 2004   02/02/21 0500  Body fluid culture w Gram Stain  Tomorrow morning,   R       Question:  Are there also cytology or pathology orders on this specimen?  Answer:  No   02/01/21 2004          SignedLorin Glass, MD [facility] 02/02/2021

## 2021-02-02 NOTE — Progress Notes (Signed)
ID Pharmacy Note  It was noted that [name] had a culture in process from  IR drainage of his liver abscess on 4/15 at [facility] that grew Bacteroides + E Coli. I spoke with [facility] microbiology lab today and susceptibilities are still pending. We can call [facility] at [phone] to check on these susceptibilities.   Sharin Mons, PharmD, BCPS, BCIDP Infectious Diseases Clinical Pharmacist Phone: [phone] 02/02/2021 12:14 PM

## 2021-02-02 NOTE — Consult Note (Signed)
Chief Complaint: Patient was seen in consultation today for placemen of hepatic/abdominal abscess drains.  Referring Physician(s): Opyd, Marcial Pacas  Supervising Physician: Richarda Overlie  Patient Status: [facility] - In-pt  History of Present Illness: Maurice Hodges is a 60 y.o. male with a past medical history significant for poorly controlled DM being seen today for placement of hepatic/abdominal abscess drains. [name] presented to urgent care on 4/13 with complaints of abdominal pain and chills - he was found to have glucose >600 and was sent to the ED. In the ED at [facility] he experienced hypotension with hyperglycemia, elevated creatinine and LFTs. CT showed a large hepatic abscess and a drain was placed in IR on 4/15, cultures from this aspirate showed e.coli and bacteroides fragilis. He has been transferred to [facility] for further care. IR has been asked to see patient for additional abscess drain placement.  Patient seen in his room, son at bedside. He speaks broken [place] but declines interpreter and seems to understand what the plan is today. He states he feels ok and is ready to have the drains placed. He is asking if he can be asleep this time.   Past Medical History:  Diagnosis Date  . Diabetes mellitus without complication (HCC)     History reviewed. No pertinent surgical history.  Allergies: Patient has no known allergies.  Medications: Prior to Admission medications   Medication Sig Start Date End Date Taking? Authorizing Provider  atorvastatin (LIPITOR) 40 MG tablet Take 40 mg by mouth daily. 11/21/20  Yes [provider]  gabapentin (NEURONTIN) 300 MG capsule Take 1 capsule by mouth 3 (three) times daily. 11/22/20  Yes [provider]  metFORMIN (GLUCOPHAGE) 1000 MG tablet Take 1 tablet by mouth 2 (two) times daily. 11/23/20  Yes [provider]     Family History  Problem Relation Age of Onset  . Diabetes Other   . Heart attack Neg Hx   .  Stroke Neg Hx     Social History   Socioeconomic History  . Marital status: Married    Spouse name: Not on file  . Number of children: Not on file  . Years of education: Not on file  . Highest education level: Not on file  Occupational History  . Not on file  Tobacco Use  . Smoking status: Never Smoker  . Smokeless tobacco: Never Used  Substance and Sexual Activity  . Alcohol use: Not Currently  . Drug use: Never  . Sexual activity: Not on file  Other Topics Concern  . Not on file  Social History Narrative  . Not on file   Social Determinants of Health   Financial Resource Strain: Not on file  Food Insecurity: Not on file  Transportation Needs: Not on file  Physical Activity: Not on file  Stress: Not on file  Social Connections: Not on file     Review of Systems: A 12 point ROS discussed and pertinent positives are indicated in the HPI above.  All other systems are negative.  Review of Systems  Constitutional: Negative for chills and fever.  Respiratory: Negative for cough and shortness of breath.   Cardiovascular: Negative for chest pain.  Gastrointestinal: Negative for abdominal pain, nausea and vomiting.  Musculoskeletal: Negative for back pain.  Neurological: Negative for dizziness and headaches.    Vital Signs: BP 115/85 (BP Location: Left Arm)   Pulse 98   Temp 98.5 F (36.9 C) (Oral)   Resp 18   SpO2 96%  Physical Exam Vitals and nursing note reviewed.  Constitutional:      General: He is not in acute distress. HENT:     Head: Normocephalic.     Mouth/Throat:     Mouth: Mucous membranes are moist.     Pharynx: Oropharynx is clear. No oropharyngeal exudate or posterior oropharyngeal erythema.  Cardiovascular:     Rate and Rhythm: Normal rate and regular rhythm.  Pulmonary:     Effort: Pulmonary effort is normal.     Breath sounds: Normal breath sounds.  Abdominal:     General: There is no distension.     Palpations: Abdomen is soft.   Skin:    General: Skin is warm and dry.  Neurological:     Mental Status: He is alert and oriented to person, place, and time.  Psychiatric:        Mood and Affect: Mood normal.        Behavior: Behavior normal.        Thought Content: Thought content normal.        Judgment: Judgment normal.      MD Evaluation Airway: WNL Heart: WNL Abdomen: WNL Chest/ Lungs: WNL ASA  Classification: 3 Mallampati/Airway Score: Two   Imaging: DG CHEST PORT 1 VIEW  Result Date: 02/01/2021 CLINICAL DATA:  Check PICC line placement EXAM: PORTABLE CHEST 1 VIEW COMPARISON:  CT from earlier in the same day. FINDINGS: Cardiac shadow is enlarged in size. PICC line is noted with the tip at the cavoatrial junction in satisfactory position. Bibasilar atelectatic changes are noted with pleural effusions right considerably greater than left similar to that seen on recent CT examination. No acute bony abnormality is noted. Drainage catheter is noted in the right upper quadrant consistent with the known hepatic abscess catheter. IMPRESSION: PICC line in satisfactory position. Bibasilar atelectasis right greater than left with associated effusions right greater than left. Right upper quadrant drainage catheter stable in appearance. Electronically Signed   By: Alcide Clever M.D.   On: 02/01/2021 21:11    Labs:  CBC: Recent Labs    02/02/21 0417  WBC 12.0*  HGB 9.6*  HCT 30.0*  PLT 466*    COAGS: No results for input(s): INR, APTT in the last 8760 hours.  BMP: Recent Labs    02/02/21 0417  NA 133*  K 3.1*  CL 99  CO2 27  GLUCOSE 122*  BUN 6  CALCIUM 7.6*  CREATININE 0.57*  GFRNONAA >60    LIVER FUNCTION TESTS: Recent Labs    02/02/21 0417  BILITOT 0.6  AST 16  ALT 23  ALKPHOS 94  PROT 5.1*  ALBUMIN 1.2*    TUMOR MARKERS: No results for input(s): AFPTM, CEA, CA199, CHROMGRNA in the last 8760 hours.  Assessment and Plan:  60 y/o M with history of poorly controlled DM found to  have multiple liver/abdominal abscesses s/p hepatic abscess drain placement x 1 in IR at [facility] who has been transferred to [facility] for further care. IR has been asked to see patient for additional drain placement - patient history and imaging have been reviewed by IR attending Dr. Lowella Dandy who agrees to procedure. Discussed with patient that he will likely have multiple drains placed today which he understands and agrees to.  Risks and benefits discussed with the patient including bleeding, infection, damage to adjacent structures, bowel perforation/fistula connection, and sepsis.  All of the patient's questions were answered, patient is agreeable to proceed.  Consent signed and in chart.  Thank  you for this interesting consult.  I greatly enjoyed meeting Vitaliy Eisenhour and look forward to participating in their care.  A copy of this report was sent to the requesting provider on this date.  Electronically Signed: Villa Herb, PA-C 02/02/2021, 10:39 AM   I spent a total of 40 Minutes  in face to face in clinical consultation, greater than 50% of which was counseling/coordinating care for hepatic/abdominal abscess drain placement.

## 2021-02-03 DIAGNOSIS — Z789 Other specified health status: Secondary | ICD-10-CM

## 2021-02-03 DIAGNOSIS — K750 Abscess of liver: Secondary | ICD-10-CM | POA: Diagnosis not present

## 2021-02-03 DIAGNOSIS — R188 Other ascites: Secondary | ICD-10-CM

## 2021-02-03 DIAGNOSIS — N179 Acute kidney failure, unspecified: Secondary | ICD-10-CM | POA: Diagnosis not present

## 2021-02-03 LAB — RETICULOCYTES
Immature Retic Fract: 21 % — ABNORMAL HIGH (ref 2.3–15.9)
RBC.: 3.46 MIL/uL — ABNORMAL LOW (ref 4.22–5.81)
Retic Count, Absolute: 108.3 10*3/uL (ref 19.0–186.0)
Retic Ct Pct: 3.1 % (ref 0.4–3.1)

## 2021-02-03 LAB — COMPREHENSIVE METABOLIC PANEL
ALT: 17 U/L (ref 0–44)
AST: 14 U/L — ABNORMAL LOW (ref 15–41)
Albumin: 1.2 g/dL — ABNORMAL LOW (ref 3.5–5.0)
Alkaline Phosphatase: 76 U/L (ref 38–126)
Anion gap: 6 (ref 5–15)
BUN: 7 mg/dL (ref 6–20)
CO2: 29 mmol/L (ref 22–32)
Calcium: 7.5 mg/dL — ABNORMAL LOW (ref 8.9–10.3)
Chloride: 98 mmol/L (ref 98–111)
Creatinine, Ser: 0.5 mg/dL — ABNORMAL LOW (ref 0.61–1.24)
GFR, Estimated: >60 mL/min (ref >60)
Glucose, Bld: 147 mg/dL — ABNORMAL HIGH (ref 70–99)
Potassium: 3.9 mmol/L (ref 3.5–5.1)
Sodium: 133 mmol/L — ABNORMAL LOW (ref 135–145)
Total Bilirubin: 0.5 mg/dL (ref 0.3–1.2)
Total Protein: 5.1 g/dL — ABNORMAL LOW (ref 6.5–8.1)

## 2021-02-03 LAB — CBC WITH DIFFERENTIAL/PLATELET
Abs Immature Granulocytes: 0.12 10*3/uL — ABNORMAL HIGH (ref 0.00–0.07)
Basophils Absolute: 0 10*3/uL (ref 0.0–0.1)
Basophils Relative: 0 %
Eosinophils Absolute: 0.1 10*3/uL (ref 0.0–0.5)
Eosinophils Relative: 1 %
HCT: 29.6 % — ABNORMAL LOW (ref 39.0–52.0)
Hemoglobin: 9.5 g/dL — ABNORMAL LOW (ref 13.0–17.0)
Immature Granulocytes: 1 %
Lymphocytes Relative: 15 %
Lymphs Abs: 1.4 10*3/uL (ref 0.7–4.0)
MCH: 27.2 pg (ref 26.0–34.0)
MCHC: 32.1 g/dL (ref 30.0–36.0)
MCV: 84.8 fL (ref 80.0–100.0)
Monocytes Absolute: 0.5 10*3/uL (ref 0.1–1.0)
Monocytes Relative: 5 %
Neutro Abs: 7.3 10*3/uL (ref 1.7–7.7)
Neutrophils Relative %: 78 %
Platelets: 415 10*3/uL — ABNORMAL HIGH (ref 150–400)
RBC: 3.49 MIL/uL — ABNORMAL LOW (ref 4.22–5.81)
RDW: 14.7 % (ref 11.5–15.5)
WBC: 9.4 10*3/uL (ref 4.0–10.5)
nRBC: 0 % (ref 0.0–0.2)

## 2021-02-03 LAB — GLUCOSE, CAPILLARY
Glucose-Capillary: 121 mg/dL — ABNORMAL HIGH (ref 70–99)
Glucose-Capillary: 147 mg/dL — ABNORMAL HIGH (ref 70–99)
Glucose-Capillary: 153 mg/dL — ABNORMAL HIGH (ref 70–99)
Glucose-Capillary: 159 mg/dL — ABNORMAL HIGH (ref 70–99)
Glucose-Capillary: 177 mg/dL — ABNORMAL HIGH (ref 70–99)
Glucose-Capillary: 213 mg/dL — ABNORMAL HIGH (ref 70–99)

## 2021-02-03 LAB — MAGNESIUM: Magnesium: 1.6 mg/dL — ABNORMAL LOW (ref 1.7–2.4)

## 2021-02-03 LAB — HEPATITIS PANEL, ACUTE
HCV Ab: NONREACTIVE
Hep A IgM: NONREACTIVE
Hep B C IgM: NONREACTIVE
Hepatitis B Surface Ag: NONREACTIVE

## 2021-02-03 LAB — IRON AND TIBC
Iron: 18 ug/dL — ABNORMAL LOW (ref 45–182)
Saturation Ratios: 13 % — ABNORMAL LOW (ref 17.9–39.5)
TIBC: 136 ug/dL — ABNORMAL LOW (ref 250–450)
UIBC: 118 ug/dL

## 2021-02-03 LAB — FOLATE: Folate: 18.2 ng/mL (ref >5.9)

## 2021-02-03 LAB — VITAMIN B12: Vitamin B-12: 1072 pg/mL — ABNORMAL HIGH (ref 180–914)

## 2021-02-03 LAB — PHOSPHORUS: Phosphorus: 3.2 mg/dL (ref 2.5–4.6)

## 2021-02-03 LAB — FERRITIN: Ferritin: 1113 ng/mL — ABNORMAL HIGH (ref 24–336)

## 2021-02-03 MED ORDER — METFORMIN HCL 500 MG PO TABS
1000.0000 mg | ORAL_TABLET | Freq: Two times a day (BID) | ORAL | Status: DC
  Administered 2021-02-03 – 2021-02-08 (×11): 1000 mg via ORAL
  Filled 2021-02-03 (×12): qty 2

## 2021-02-03 MED ORDER — MAGNESIUM SULFATE 2 GM/50ML IV SOLN
2.0000 g | Freq: Once | INTRAVENOUS | Status: AC
  Administered 2021-02-03: 2 g via INTRAVENOUS
  Filled 2021-02-03: qty 50

## 2021-02-03 MED ORDER — GLIPIZIDE ER 2.5 MG PO TB24
2.5000 mg | ORAL_TABLET | Freq: Every day | ORAL | Status: DC
  Administered 2021-02-03 – 2021-02-08 (×6): 2.5 mg via ORAL
  Filled 2021-02-03 (×7): qty 1

## 2021-02-03 NOTE — Progress Notes (Signed)
Nutrition Brief Note  RD consulted for assess of nutritional requirements/ status.   Wt Readings from Last 15 Encounters:  02/03/21 70.4 kg   Maurice Hodges is a 60 y.o. male with medical history significant for type 2 diabetes mellitus, presented to urgent care on 01/26/2020 after he developed chills and abdominal discomfort, was found to have glucose greater than 600, and was directed to the ED.  He had been taking metformin only for diabetes, had been experiencing some abdominal discomfort that he described as mild and generalized, but became concerned when he developed chills and some fatigue.  Pt admitted with liver abscesses and bacteremia.   Reviewed I/O's: -291 ml x 24 hours and +130 ml since admission  UOP 540 ml x 24 hours  Drain output: 231 ml x 24 hours  Spoke with pt and son at bedside. Pt reports feeling better and having a great appetite. He consumed almost 100% of breakfast tray. Noted meal completions 100%.  PYA pt with good appetite. He usually consumes 3 meals and 1 snack daily (Breakfast: tortilla with eggs; Snack: fruit or sugar free cookies; Lunch and Dinner: meat, starch, and vegetable). He reports he rarely drinks soda and drinks mostly water.   Per pt, he has had no recent wt loss. He reports UBW is around 150#. Per son, pt experienced gradulal weight loss when being diagnosed with DM approximately 3 years ago secondary to lifestyle changes.   Nutrition-Focused physical exam completed. Findings are no fat depletion, no muscle depletion, and no edema.   Albumin has a half-life of 21 days and is strongly affected by stress response and inflammatory process, therefore, do not expect to see an improvement in this lab value during acute hospitalization. When a patient presents with low albumin, it is likely skewed due to the acute inflammatory response.  Unless it is suspected that patient had poor PO intake or malnutrition prior to admission, then RD should not be  consulted solely for low albumin. Note that low albumin is no longer used to diagnose malnutrition; [place] uses the new malnutrition guidelines published by the American Society for Parenteral and Enteral Nutrition (A.S.P.E.N.) and the Academy of Nutrition and Dietetics (AND).    No results found for: HGBA1C PTA DM medications are 1000 mg metformin BID.   Labs reviewed: Na: 133, Mg: 1.6 (on IV supplementation), CBGS: 121-194 (inpatient orders for glycemic control are 2.5 mg glipizide daily, 1000 mg metformin BID, and 0-15 units inuslin aspart TID with meals).   Nutrition-Focused physical exam completed. Findings are no fat depletion, no muscle depletion, and no edema.   Current diet order is carb modified, patient is consuming approximately 100% of meals at this time. Labs and medications reviewed.   No nutrition interventions warranted at this time. If nutrition issues arise, please consult RD.   Levada Schilling, RD, LDN, CDCES Registered Dietitian II Certified Diabetes Care and Education Specialist Please refer to [facility] for RD and/or RD on-call/weekend/after hours pager

## 2021-02-03 NOTE — Progress Notes (Signed)
Referring Physician(s): Opyd, [facility])  Supervising Physician: Marliss Coots  Patient Status:  [facility] - In-pt  Chief Complaint: Follow up hepatic abscess drains x 2 and intra-abdominal abscess drain x 1  Subjective:  Patient laying in bed, reports he feels pretty good except some mild lower abdominal pain. He ate some breakfast without issue. No BM so far today.  Allergies: Patient has no known allergies.  Medications: Prior to Admission medications   Medication Sig Start Date End Date Taking? Authorizing Provider  atorvastatin (LIPITOR) 40 MG tablet Take 40 mg by mouth daily. 11/21/20  Yes [provider]  gabapentin (NEURONTIN) 300 MG capsule Take 1 capsule by mouth 3 (three) times daily. 11/22/20  Yes [provider]  metFORMIN (GLUCOPHAGE) 1000 MG tablet Take 1 tablet by mouth 2 (two) times daily. 11/23/20  Yes [provider]     Vital Signs: BP 130/89 (BP Location: Left Arm)   Pulse (!) 101   Temp 98.1 F (36.7 C) (Axillary)   Resp 20   SpO2 93%   Physical Exam Vitals and nursing note reviewed.  Constitutional:      General: He is not in acute distress. HENT:     Head: Normocephalic.  Cardiovascular:     Rate and Rhythm: Normal rate.  Pulmonary:     Effort: Pulmonary effort is normal.  Abdominal:     Palpations: Abdomen is soft.     Comments: Drain labeled #1 (original hepatic abscess drain) to suction with ~20 cc of cloudy tan fluid Drain labeled #2 (RUQ hepatic abscess drain) to suction with ~10 cc hazy serous output Drain labeled #3 (RLQ intra-abdominal abscess drain) to suction with ~50 cc hazy serous output.  All drains flushed and aspirated without difficulty on exam today.  Skin:    General: Skin is warm and dry.  Neurological:     Mental Status: He is alert.     Imaging: DG CHEST PORT 1 VIEW  Result Date: 02/01/2021 CLINICAL DATA:  Check PICC line placement EXAM: PORTABLE CHEST 1 VIEW COMPARISON:  CT from earlier in  the same day. FINDINGS: Cardiac shadow is enlarged in size. PICC line is noted with the tip at the cavoatrial junction in satisfactory position. Bibasilar atelectatic changes are noted with pleural effusions right considerably greater than left similar to that seen on recent CT examination. No acute bony abnormality is noted. Drainage catheter is noted in the right upper quadrant consistent with the known hepatic abscess catheter. IMPRESSION: PICC line in satisfactory position. Bibasilar atelectasis right greater than left with associated effusions right greater than left. Right upper quadrant drainage catheter stable in appearance. Electronically Signed   By: Alcide Clever M.D.   On: 02/01/2021 21:11   CT IMAGE GUIDED DRAINAGE BY PERCUTANEOUS CATHETER  Result Date: 02/02/2021 INDICATION: 60 year old with a hepatic abscess and status post percutaneous drainage. Subsequently, the patient has developed intra-abdominal fluid collections that are concerning for abscesses. Patient presents for placement of additional CT-guided drains. EXAM: CT-GUIDED PLACEMENT OF A DRAIN IN THE RIGHT UPPER ABDOMINAL FLUID COLLECTION CT-GUIDED PLACEMENT OF A DRAIN IN THE RIGHT LOWER ABDOMINAL FLUID COLLECTION MEDICATIONS: Moderate sedation ANESTHESIA/SEDATION: Fentanyl 200 mcg IV; Versed 4.0 mg IV Moderate Sedation Time:  45 minutes The patient was continuously monitored during the procedure by the interventional radiology nurse under my direct supervision. COMPLICATIONS: None immediate. PROCEDURE: Informed written consent was obtained from the patient after a thorough discussion of the procedural risks, benefits and alternatives. All questions were addressed.  Maximal Sterile Barrier Technique was utilized including caps, mask, sterile gowns, sterile gloves, sterile drape, hand hygiene and skin antiseptic. A timeout was performed prior to the initiation of the procedure. Patient was placed supine on the CT scanner. Images through the  abdomen and pelvis were obtained. The fluid collection in the right upper abdomen just inferior to the right hepatic lobe was identified and targeted. The large fluid collection in the right lower abdomen was identified and targeted. Right side of the abdomen was prepped with chlorhexidine and sterile field was created. The right upper abdominal fluid collection was targeted first. The skin was anesthetized with 1% lidocaine. Using CT guidance, an 18 gauge trocar needle was directed into the fluid collection just below the right hepatic lobe. Cloudy yellow fluid was aspirated. Superstiff Amplatz wire was advanced into the collection. Needle was removed over the wire. The tract was dilated to accommodate a 10 [place] multipurpose drain. Catheter was attached to a suction bulb and approximately 50 mL of cloudy yellow fluid was removed. Attention was directed to the right lower abdominal fluid collection. Skin was anesthetized with 1% lidocaine. A small incision was made. Using CT guidance, an 18 gauge trocar needle was directed into the right lower abdominal fluid collection. Yellow fluid was aspirated. Superstiff Amplatz wire was advanced into the collection and the tract was dilated to accommodate a 10 [place] multipurpose drain. Additional CT images were obtained. CT demonstrated that the right lower abdominal drain was crossing the midline and would likely decompress the pelvic fluid collection as well. Approximately 325 mL of cloudy yellow fluid was removed from the right lower quadrant drain using an evacuation bag. Drain was attached to a suction bulb at the end of the procedure. Both drains was secured to the skin with suture and StatLock. Dressings were placed. FINDINGS: Complex or loculated fluid collection along the inferior right hepatic lobe. Drain was placed within this collection along the inferior right hepatic lobe and 50 mL of cloudy yellow fluid was removed. Again noted is an intrahepatic drainage  catheter within the abscess. Residual areas of low-density and gas within the hepatic abscess. Large fluid collection in the lower abdomen and pelvis. Right lower quadrant drain was placed and crossed the midline. Fortunately, this drain was able to decompress the left side of the lower abdominal fluid collection and the pelvic fluid collection. 325 mL of cloudy yellow fluid was removed from the lower abdominal drain. IMPRESSION: 1. CT-guided placement of a drainage catheter in the right upper abdominal fluid collection just inferior to the right hepatic lobe. 2. CT-guided placement of a drainage catheter in the right lower abdominal fluid collection. Fortunately, this drain cross the midline and was also decompressing the left lower abdominal fluid collection and the pelvic fluid collection. 3. Hepatic drain is still intact with residual fluid and gas in the complex hepatic abscess. Electronically Signed   By: Richarda Overlie M.D.   On: 02/02/2021 16:21   CT IMAGE GUIDED DRAINAGE BY PERCUTANEOUS CATHETER  Result Date: 02/02/2021 INDICATION: 60 year old with a hepatic abscess and status post percutaneous drainage. Subsequently, the patient has developed intra-abdominal fluid collections that are concerning for abscesses. Patient presents for placement of additional CT-guided drains. EXAM: CT-GUIDED PLACEMENT OF A DRAIN IN THE RIGHT UPPER ABDOMINAL FLUID COLLECTION CT-GUIDED PLACEMENT OF A DRAIN IN THE RIGHT LOWER ABDOMINAL FLUID COLLECTION MEDICATIONS: Moderate sedation ANESTHESIA/SEDATION: Fentanyl 200 mcg IV; Versed 4.0 mg IV Moderate Sedation Time:  45 minutes The patient was continuously monitored during  the procedure by the interventional radiology nurse under my direct supervision. COMPLICATIONS: None immediate. PROCEDURE: Informed written consent was obtained from the patient after a thorough discussion of the procedural risks, benefits and alternatives. All questions were addressed. Maximal Sterile Barrier  Technique was utilized including caps, mask, sterile gowns, sterile gloves, sterile drape, hand hygiene and skin antiseptic. A timeout was performed prior to the initiation of the procedure. Patient was placed supine on the CT scanner. Images through the abdomen and pelvis were obtained. The fluid collection in the right upper abdomen just inferior to the right hepatic lobe was identified and targeted. The large fluid collection in the right lower abdomen was identified and targeted. Right side of the abdomen was prepped with chlorhexidine and sterile field was created. The right upper abdominal fluid collection was targeted first. The skin was anesthetized with 1% lidocaine. Using CT guidance, an 18 gauge trocar needle was directed into the fluid collection just below the right hepatic lobe. Cloudy yellow fluid was aspirated. Superstiff Amplatz wire was advanced into the collection. Needle was removed over the wire. The tract was dilated to accommodate a 10 [place] multipurpose drain. Catheter was attached to a suction bulb and approximately 50 mL of cloudy yellow fluid was removed. Attention was directed to the right lower abdominal fluid collection. Skin was anesthetized with 1% lidocaine. A small incision was made. Using CT guidance, an 18 gauge trocar needle was directed into the right lower abdominal fluid collection. Yellow fluid was aspirated. Superstiff Amplatz wire was advanced into the collection and the tract was dilated to accommodate a 10 [place] multipurpose drain. Additional CT images were obtained. CT demonstrated that the right lower abdominal drain was crossing the midline and would likely decompress the pelvic fluid collection as well. Approximately 325 mL of cloudy yellow fluid was removed from the right lower quadrant drain using an evacuation bag. Drain was attached to a suction bulb at the end of the procedure. Both drains was secured to the skin with suture and StatLock. Dressings were placed.  FINDINGS: Complex or loculated fluid collection along the inferior right hepatic lobe. Drain was placed within this collection along the inferior right hepatic lobe and 50 mL of cloudy yellow fluid was removed. Again noted is an intrahepatic drainage catheter within the abscess. Residual areas of low-density and gas within the hepatic abscess. Large fluid collection in the lower abdomen and pelvis. Right lower quadrant drain was placed and crossed the midline. Fortunately, this drain was able to decompress the left side of the lower abdominal fluid collection and the pelvic fluid collection. 325 mL of cloudy yellow fluid was removed from the lower abdominal drain. IMPRESSION: 1. CT-guided placement of a drainage catheter in the right upper abdominal fluid collection just inferior to the right hepatic lobe. 2. CT-guided placement of a drainage catheter in the right lower abdominal fluid collection. Fortunately, this drain cross the midline and was also decompressing the left lower abdominal fluid collection and the pelvic fluid collection. 3. Hepatic drain is still intact with residual fluid and gas in the complex hepatic abscess. Electronically Signed   By: Richarda OverlieAdam  Henn M.D.   On: 02/02/2021 16:21    Labs:  CBC: Recent Labs    02/02/21 0417 02/03/21 0515  WBC 12.0* 9.4  HGB 9.6* 9.5*  HCT 30.0* 29.6*  PLT 466* 415*    COAGS: No results for input(s): INR, APTT in the last 8760 hours.  BMP: Recent Labs    02/02/21 0417 02/03/21  0515  NA 133* 133*  K 3.1* 3.9  CL 99 98  CO2 27 29  GLUCOSE 122* 147*  BUN 6 7  CALCIUM 7.6* 7.5*  CREATININE 0.57* 0.50*  GFRNONAA >60 >60    LIVER FUNCTION TESTS: Recent Labs    02/02/21 0417 02/03/21 0515  BILITOT 0.6 0.5  AST 16 14*  ALT 23 17  ALKPHOS 94 76  PROT 5.1* 5.1*  ALBUMIN 1.2* 1.2*    Assessment and Plan:  60 y/o M s/p hepatic abscess drain placed at [facility] on 4/15 with additional hepatic abscess and intra-abdominal abscess drains  placed yesterday in IR.   Drain labeled #1 (original hepatic abscess drain) - approximately 20 cc purulent fluid in suction bulb Drain labeled #2 (RUQ hepatic abscess drain) - approximately 10 cc hazy serous fluid in suction bulb Drain labeled #3 (RLQ intra-abdominal abscess drain) - approximately 50 cc hazy serous fluid in suction bulb  All drains flushed/aspirated without difficulty on exam today. Cultures pending.  Continue Qshift drain flushes, record output when suction bulbs are emptied, dressing changes every 2-3 days or earlier if soiled. Plan for repeat CT likely Monday to reassess fluid collections.  Please call IR with questions or concerns.  Electronically Signed: Villa Herb, PA-C 02/03/2021, 11:33 AM   I spent a total of 15 Minutes at the the patient's bedside AND on the patient's hospital floor or unit, greater than 50% of which was counseling/coordinating care for hepatic abscess drain x 2 and intra-abdominal abscess drain x 1 follow up.

## 2021-02-03 NOTE — Progress Notes (Signed)
Subjective: No new complaints   Antibiotics:  Anti-infectives (From admission, onward)   Start     Dose/Rate Route Frequency Ordered Stop   02/01/21 2200  meropenem (MERREM) 1 g in sodium chloride 0.9 % 100 mL IVPB        1 g 200 mL/hr over 30 Minutes Intravenous Every 8 hours 02/01/21 2004        Medications: Scheduled Meds: . Chlorhexidine Gluconate Cloth  6 each Topical Daily  . glipiZIDE  2.5 mg Oral Q breakfast  . insulin aspart  0-15 Units Subcutaneous Q4H  . metFORMIN  1,000 mg Oral BID WC  . sodium chloride flush  10-40 mL Intracatheter Q12H  . sodium chloride flush  5 mL Intracatheter Q8H   Continuous Infusions: . meropenem (MERREM) IV 1 g (02/03/21 1439)   PRN Meds:.acetaminophen **OR** acetaminophen, HYDROmorphone (DILAUDID) injection, ondansetron **OR** ondansetron (ZOFRAN) IV, oxyCODONE, polyethylene glycol, sodium chloride flush    Objective: Weight change:   Intake/Output Summary (Last 24 hours) at 02/03/2021 1659 Last data filed at 02/03/2021 1158 Gross per 24 hour  Intake 660 ml  Output 681 ml  Net -21 ml   Blood pressure 131/73, pulse (!) 109, temperature 97.9 F (36.6 C), resp. rate 16, height 5\' 6"  (1.676 m), weight 70.4 kg, SpO2 95 %. Temp:  [97.9 F (36.6 C)-99.3 F (37.4 C)] 97.9 F (36.6 C) (04/22 1433) Pulse Rate:  [99-109] 109 (04/22 1433) Resp:  [16-20] 16 (04/22 1433) BP: (115-131)/(73-89) 131/73 (04/22 1433) SpO2:  [93 %-97 %] 95 % (04/22 1433) Weight:  [70.4 kg] 70.4 kg (04/22 1146)  Physical Exam: Physical Exam Constitutional:      Appearance: He is well-developed.  HENT:     Head: Normocephalic and atraumatic.  Eyes:     Conjunctiva/sclera: Conjunctivae normal.  Cardiovascular:     Rate and Rhythm: Normal rate and regular rhythm.     Heart sounds: No murmur heard. No friction rub. No gallop.   Pulmonary:     Effort: Pulmonary effort is normal. No respiratory distress.     Breath sounds: No wheezing.   Abdominal:     General: There is no distension.     Palpations: Abdomen is soft.  Musculoskeletal:        General: Normal range of motion.     Cervical back: Normal range of motion and neck supple.  Skin:    General: Skin is warm and dry.     Findings: No erythema or rash.  Neurological:     General: No focal deficit present.     Mental Status: He is alert and oriented to person, place, and time.  Psychiatric:        Behavior: Behavior normal.        Thought Content: Thought content normal.        Judgment: Judgment normal.     3 drains in place.    CBC:    BMET Recent Labs    02/02/21 0417 02/03/21 0515  NA 133* 133*  K 3.1* 3.9  CL 99 98  CO2 27 29  GLUCOSE 122* 147*  BUN 6 7  CREATININE 0.57* 0.50*  CALCIUM 7.6* 7.5*     Liver Panel  Recent Labs    02/02/21 0417 02/03/21 0515  PROT 5.1* 5.1*  ALBUMIN 1.2* 1.2*  AST 16 14*  ALT 23 17  ALKPHOS 94 76  BILITOT 0.6 0.5       Sedimentation Rate No results  for input(s): ESRSEDRATE in the last 72 hours. C-Reactive Protein No results for input(s): CRP in the last 72 hours.  Micro Results: Recent Results (from the past 720 hour(s))  Aerobic/Anaerobic Culture (surgical/deep wound)     Status: None (Preliminary result)   Collection Time: 02/02/21 12:43 PM   Specimen: Abscess; Abdominal Fluid  Result Value Ref Range Status   Specimen Description ABSCESS LIVER  Final   Special Requests DRAINAGE  Final   Gram Stain   Final    ABUNDANT WBC PRESENT,BOTH PMN AND MONONUCLEAR NO ORGANISMS SEEN    Culture   Final    NO GROWTH < 24 HOURS Performed at [facility], [address], [place], [place] [address]    Report Status PENDING  Incomplete    Studies/Results: DG CHEST PORT 1 VIEW  Result Date: 02/01/2021 CLINICAL DATA:  Check PICC line placement EXAM: PORTABLE CHEST 1 VIEW COMPARISON:  CT from earlier in the same day. FINDINGS: Cardiac shadow is enlarged in size. PICC line is noted with the  tip at the cavoatrial junction in satisfactory position. Bibasilar atelectatic changes are noted with pleural effusions right considerably greater than left similar to that seen on recent CT examination. No acute bony abnormality is noted. Drainage catheter is noted in the right upper quadrant consistent with the known hepatic abscess catheter. IMPRESSION: PICC line in satisfactory position. Bibasilar atelectasis right greater than left with associated effusions right greater than left. Right upper quadrant drainage catheter stable in appearance. Electronically Signed   By: Alcide Clever M.D.   On: 02/01/2021 21:11   CT IMAGE GUIDED DRAINAGE BY PERCUTANEOUS CATHETER  Result Date: 02/02/2021 INDICATION: 60 year old with a hepatic abscess and status post percutaneous drainage. Subsequently, the patient has developed intra-abdominal fluid collections that are concerning for abscesses. Patient presents for placement of additional CT-guided drains. EXAM: CT-GUIDED PLACEMENT OF A DRAIN IN THE RIGHT UPPER ABDOMINAL FLUID COLLECTION CT-GUIDED PLACEMENT OF A DRAIN IN THE RIGHT LOWER ABDOMINAL FLUID COLLECTION MEDICATIONS: Moderate sedation ANESTHESIA/SEDATION: Fentanyl 200 mcg IV; Versed 4.0 mg IV Moderate Sedation Time:  45 minutes The patient was continuously monitored during the procedure by the interventional radiology nurse under my direct supervision. COMPLICATIONS: None immediate. PROCEDURE: Informed written consent was obtained from the patient after a thorough discussion of the procedural risks, benefits and alternatives. All questions were addressed. Maximal Sterile Barrier Technique was utilized including caps, mask, sterile gowns, sterile gloves, sterile drape, hand hygiene and skin antiseptic. A timeout was performed prior to the initiation of the procedure. Patient was placed supine on the CT scanner. Images through the abdomen and pelvis were obtained. The fluid collection in the right upper abdomen just  inferior to the right hepatic lobe was identified and targeted. The large fluid collection in the right lower abdomen was identified and targeted. Right side of the abdomen was prepped with chlorhexidine and sterile field was created. The right upper abdominal fluid collection was targeted first. The skin was anesthetized with 1% lidocaine. Using CT guidance, an 18 gauge trocar needle was directed into the fluid collection just below the right hepatic lobe. Cloudy yellow fluid was aspirated. Superstiff Amplatz wire was advanced into the collection. Needle was removed over the wire. The tract was dilated to accommodate a 10 [place] multipurpose drain. Catheter was attached to a suction bulb and approximately 50 mL of cloudy yellow fluid was removed. Attention was directed to the right lower abdominal fluid collection. Skin was anesthetized with 1% lidocaine. A small incision  was made. Using CT guidance, an 18 gauge trocar needle was directed into the right lower abdominal fluid collection. Yellow fluid was aspirated. Superstiff Amplatz wire was advanced into the collection and the tract was dilated to accommodate a 10 [place] multipurpose drain. Additional CT images were obtained. CT demonstrated that the right lower abdominal drain was crossing the midline and would likely decompress the pelvic fluid collection as well. Approximately 325 mL of cloudy yellow fluid was removed from the right lower quadrant drain using an evacuation bag. Drain was attached to a suction bulb at the end of the procedure. Both drains was secured to the skin with suture and StatLock. Dressings were placed. FINDINGS: Complex or loculated fluid collection along the inferior right hepatic lobe. Drain was placed within this collection along the inferior right hepatic lobe and 50 mL of cloudy yellow fluid was removed. Again noted is an intrahepatic drainage catheter within the abscess. Residual areas of low-density and gas within the hepatic  abscess. Large fluid collection in the lower abdomen and pelvis. Right lower quadrant drain was placed and crossed the midline. Fortunately, this drain was able to decompress the left side of the lower abdominal fluid collection and the pelvic fluid collection. 325 mL of cloudy yellow fluid was removed from the lower abdominal drain. IMPRESSION: 1. CT-guided placement of a drainage catheter in the right upper abdominal fluid collection just inferior to the right hepatic lobe. 2. CT-guided placement of a drainage catheter in the right lower abdominal fluid collection. Fortunately, this drain cross the midline and was also decompressing the left lower abdominal fluid collection and the pelvic fluid collection. 3. Hepatic drain is still intact with residual fluid and gas in the complex hepatic abscess. Electronically Signed   By: Richarda Overlie M.D.   On: 02/02/2021 16:21   CT IMAGE GUIDED DRAINAGE BY PERCUTANEOUS CATHETER  Result Date: 02/02/2021 INDICATION: 60 year old with a hepatic abscess and status post percutaneous drainage. Subsequently, the patient has developed intra-abdominal fluid collections that are concerning for abscesses. Patient presents for placement of additional CT-guided drains. EXAM: CT-GUIDED PLACEMENT OF A DRAIN IN THE RIGHT UPPER ABDOMINAL FLUID COLLECTION CT-GUIDED PLACEMENT OF A DRAIN IN THE RIGHT LOWER ABDOMINAL FLUID COLLECTION MEDICATIONS: Moderate sedation ANESTHESIA/SEDATION: Fentanyl 200 mcg IV; Versed 4.0 mg IV Moderate Sedation Time:  45 minutes The patient was continuously monitored during the procedure by the interventional radiology nurse under my direct supervision. COMPLICATIONS: None immediate. PROCEDURE: Informed written consent was obtained from the patient after a thorough discussion of the procedural risks, benefits and alternatives. All questions were addressed. Maximal Sterile Barrier Technique was utilized including caps, mask, sterile gowns, sterile gloves, sterile  drape, hand hygiene and skin antiseptic. A timeout was performed prior to the initiation of the procedure. Patient was placed supine on the CT scanner. Images through the abdomen and pelvis were obtained. The fluid collection in the right upper abdomen just inferior to the right hepatic lobe was identified and targeted. The large fluid collection in the right lower abdomen was identified and targeted. Right side of the abdomen was prepped with chlorhexidine and sterile field was created. The right upper abdominal fluid collection was targeted first. The skin was anesthetized with 1% lidocaine. Using CT guidance, an 18 gauge trocar needle was directed into the fluid collection just below the right hepatic lobe. Cloudy yellow fluid was aspirated. Superstiff Amplatz wire was advanced into the collection. Needle was removed over the wire. The tract was dilated to accommodate a 10 [place]  multipurpose drain. Catheter was attached to a suction bulb and approximately 50 mL of cloudy yellow fluid was removed. Attention was directed to the right lower abdominal fluid collection. Skin was anesthetized with 1% lidocaine. A small incision was made. Using CT guidance, an 18 gauge trocar needle was directed into the right lower abdominal fluid collection. Yellow fluid was aspirated. Superstiff Amplatz wire was advanced into the collection and the tract was dilated to accommodate a 10 [place] multipurpose drain. Additional CT images were obtained. CT demonstrated that the right lower abdominal drain was crossing the midline and would likely decompress the pelvic fluid collection as well. Approximately 325 mL of cloudy yellow fluid was removed from the right lower quadrant drain using an evacuation bag. Drain was attached to a suction bulb at the end of the procedure. Both drains was secured to the skin with suture and StatLock. Dressings were placed. FINDINGS: Complex or loculated fluid collection along the inferior right hepatic  lobe. Drain was placed within this collection along the inferior right hepatic lobe and 50 mL of cloudy yellow fluid was removed. Again noted is an intrahepatic drainage catheter within the abscess. Residual areas of low-density and gas within the hepatic abscess. Large fluid collection in the lower abdomen and pelvis. Right lower quadrant drain was placed and crossed the midline. Fortunately, this drain was able to decompress the left side of the lower abdominal fluid collection and the pelvic fluid collection. 325 mL of cloudy yellow fluid was removed from the lower abdominal drain. IMPRESSION: 1. CT-guided placement of a drainage catheter in the right upper abdominal fluid collection just inferior to the right hepatic lobe. 2. CT-guided placement of a drainage catheter in the right lower abdominal fluid collection. Fortunately, this drain cross the midline and was also decompressing the left lower abdominal fluid collection and the pelvic fluid collection. 3. Hepatic drain is still intact with residual fluid and gas in the complex hepatic abscess. Electronically Signed   By: Richarda Overlie M.D.   On: 02/02/2021 16:21      Assessment/Plan:  INTERVAL HISTORY: E. coli that was found at [facility] was apparently not 1 that was capable of being cultured and therefore was sent to [facility].  Maurice Hodges is checked and the sensitivities are not back yet   Principal Problem:   Bacterial liver abscess Active Problems:   Uncontrolled type II diabetes mellitus (HCC)   Hypokalemia   AKI (acute kidney injury) (HCC)   Severe sepsis (HCC)   Normocytic anemia   Liver abscess due to bacteria    Maurice Hodges is a 60 y.o. male with polymicrobial liver abscesses that worsened once his drain was accidentally turned off while at [facility].  His course was complicated by Bacteroides bacteremia.  E. coli was also isolated from abscess at [facility] but needed to be sent out to [facility]  apparently.  Patient was broadened from Zosyn to meropenem while at [facility] and remains on meropenem.  Continue meropenem for now  Follow-up cultures from our hospital and hopefully the ones that are sent to [facility] will be informative.  Maurice Hodges is available for questions this weekend and Dr. Renold Don or Dr. Elinor Parkinson will see the patient this weekend.   LOS: 2 days   Maurice Hodges 02/03/2021, 4:59 PM

## 2021-02-03 NOTE — Progress Notes (Signed)
PROGRESS NOTE  Maurice Hodges  DOB: April 28, 1961  PCP: [facility] PTW:[id number]  DOA: 02/01/2021  LOS: 2 days   Chief Complaint: Abdominal pain, chills   Brief narrative: Maurice Hodges is a 60 y.o. male with PMH significant for DM2 on metformin.   4/13, patient presented to urgent care for some chills and abdominal discomfort, he was noted to have blood sugar level greater than 600 and directed to ED at [facility].. In the ED, patient was found to be afebrile, hemodynamically stable. CT abdomen showed large hepatic abscess for which he had a percutaneous drain placed by IR on 4/15. Cultures from the abscess grew E. coli and Bacteroides fragilis.  Blood culture grew Bacteroides.  He was started on IV Zosyn.   Apparently there was an issue with the locking system of his abscess drain tube because of which it could not drain and he becomes systemically ill with fever and sweats.  Once they were able to reopen his drain, it immediately drained a lot of purulent material.  In the interim he was switched to IV meropenem.   Repeat CT scan showed additional intra-abdominal abscesses and hence he was transferred to [facility] on 4/20.  Subjective: Patient was seen and examined this morning.  Middle-aged male.  Not in distress.  No new symptoms.  Family is at bedside. Has 3 intra-abdominal drains in place. Tachycardia improving.  Blood sugar level remain controlled.  Assessment/Plan: Hepatic abscess -E. coli and bacteroid fragilis Bacteremia with bacteroids fragilis -Initially hospitalized at [facility] (4/13-4/20) with sepsis.  -Transferred to [facility] after abscess did not get better with percutaneous drainage and IV antibiotics. Sepsis has resolved by the time of transfer. -4/15 had first abscess drain placed while at [facility] -4/21, had 2 more drain placed by IR here at [facility].  New cultures sent. -PICC line in place on the right arm. -ID  following. Currently on IV meropenem.  Type 2 diabetes mellitus -A1c 14 on 4/13 -Was only on metformin 1000 mg twice daily at home. -Currently Lantus 5 units at bedtime/sliding scale insulin. -Blood sugar trending mostly under 150. -Although his A1c was significantly elevated to 14, he is requiring only little dose of Lantus to keep blood sugar checked.  I would stop the Lantus at this time and put him back on metformin 1000 mg twice daily along with glipizide 2.5 mg daily. -Continue sliding scale insulin with Accu-Cheks. Recent Labs  Lab 02/02/21 1711 02/02/21 2046 02/02/21 2358 02/03/21 0404 02/03/21 0751  GLUCAP 227* 172* 194* 153* 121*   Anemia  -Hemoglobin was reportedly at 10.8 on 4/13, lower at 9.6 on 4/21.  Anemia panel ordered for tomorrow. Recent Labs    02/02/21 0417 02/03/21 0515  HGB 9.6* 9.5*  MCV 85.2 84.8  VITAMINB12  --  1,072*  FOLATE  --  18.2  FERRITIN  --  1,113*  TIBC  --  136*  IRON  --  18*  RETICCTPCT  --  3.1   Hypokalemia/hypomagnesemia  -Potassium level improved with replacement.  Magnesium level low at 1.6 today.  Ordered for IV replacement. - Repeat chem panel in am   Recent Labs  Lab 02/02/21 0417 02/03/21 0515  K 3.1* 3.9  MG  --  1.6*  PHOS  --  3.2   Hypoalbuminemia -Albumin level severely low at 1.2.  Nutrition consult called.  Mobility: Increase ambulation Code Status:   Code Status: Full Code  Nutritional status: There is no height  or weight on file to calculate BMI.     Diet Order            Diet Carb Modified Fluid consistency: Thin; Room service appropriate? Yes  Diet effective now               Diabetic diet postprocedure  DVT prophylaxis: SCDs Start: 02/01/21 2000   Antimicrobials:  IV meropenem Fluid: None Consultants: IR, ID Family Communication:  Family at bedside  Status is: Inpatient  Remains inpatient appropriate because: Needs IV antibiotics, procedure, inpatient monitoring  Dispo: The patient  is from: Home              Anticipated d/c is to: Home in more than 3 days              Patient currently is not medically stable to d/c.   Difficult to place patient No   Infusions:  . magnesium sulfate bolus IVPB    . meropenem (MERREM) IV 1 g (02/03/21 0543)    Scheduled Meds: . Chlorhexidine Gluconate Cloth  6 each Topical Daily  . glipiZIDE  2.5 mg Oral Q breakfast  . insulin aspart  0-15 Units Subcutaneous Q4H  . metFORMIN  1,000 mg Oral BID WC  . sodium chloride flush  10-40 mL Intracatheter Q12H  . sodium chloride flush  5 mL Intracatheter Q8H    Antimicrobials: Anti-infectives (From admission, onward)   Start     Dose/Rate Route Frequency Ordered Stop   02/01/21 2200  meropenem (MERREM) 1 g in sodium chloride 0.9 % 100 mL IVPB        1 g 200 mL/hr over 30 Minutes Intravenous Every 8 hours 02/01/21 2004        PRN meds: acetaminophen **OR** acetaminophen, HYDROmorphone (DILAUDID) injection, ondansetron **OR** ondansetron (ZOFRAN) IV, oxyCODONE, polyethylene glycol, sodium chloride flush   Objective: Vitals:   02/03/21 0409 02/03/21 0946  BP: 122/82 130/89  Pulse: 99 (!) 101  Resp: 18 20  Temp: 99.3 F (37.4 C) 98.1 F (36.7 C)  SpO2: 93% 93%    Intake/Output Summary (Last 24 hours) at 02/03/2021 1110 Last data filed at 02/03/2021 0543 Gross per 24 hour  Intake 480 ml  Output 771 ml  Net -291 ml   There were no vitals filed for this visit. Weight change:  There is no height or weight on file to calculate BMI.   Physical Exam: General exam: Pleasant, middle-aged male.  Not in distress Skin: No rashes, lesions or ulcers. HEENT: Atraumatic, normocephalic, no obvious bleeding Lungs: Clear to auscultation bilaterally CVS: Regular rate and rhythm, no murmur GI/Abd soft, has 3 abdominal JP drains in place. CNS: Alert, awake, oriented x3 Psychiatry: Mood appropriate Extremities: No pedal edema, no calf tenderness  Data Review: I have personally reviewed  the laboratory data and studies available.  Recent Labs  Lab 02/02/21 0417 02/03/21 0515  WBC 12.0* 9.4  NEUTROABS 9.9* 7.3  HGB 9.6* 9.5*  HCT 30.0* 29.6*  MCV 85.2 84.8  PLT 466* 415*   Recent Labs  Lab 02/02/21 0417 02/03/21 0515  NA 133* 133*  K 3.1* 3.9  CL 99 98  CO2 27 29  GLUCOSE 122* 147*  BUN 6 7  CREATININE 0.57* 0.50*  CALCIUM 7.6* 7.5*  MG  --  1.6*  PHOS  --  3.2    F/u labs ordered Unresulted Labs (From admission, onward)          Start     Ordered  02/02/21 0500  Comprehensive metabolic panel  Daily,   R      02/01/21 2004   02/02/21 0500  CBC WITH DIFFERENTIAL  Daily,   R      02/01/21 2004   02/02/21 0500  Body fluid culture w Gram Stain  Tomorrow morning,   R       Question:  Are there also cytology or pathology orders on this specimen?  Answer:  No   02/01/21 2004          SignedLorin Glass, MD [facility] 02/03/2021

## 2021-02-04 DIAGNOSIS — K750 Abscess of liver: Secondary | ICD-10-CM | POA: Diagnosis not present

## 2021-02-04 LAB — COMPREHENSIVE METABOLIC PANEL
ALT: 18 U/L (ref 0–44)
AST: 19 U/L (ref 15–41)
Albumin: 1.2 g/dL — ABNORMAL LOW (ref 3.5–5.0)
Alkaline Phosphatase: 81 U/L (ref 38–126)
Anion gap: 5 (ref 5–15)
BUN: 10 mg/dL (ref 6–20)
CO2: 29 mmol/L (ref 22–32)
Calcium: 7.5 mg/dL — ABNORMAL LOW (ref 8.9–10.3)
Chloride: 98 mmol/L (ref 98–111)
Creatinine, Ser: 0.57 mg/dL — ABNORMAL LOW (ref 0.61–1.24)
GFR, Estimated: >60 mL/min (ref >60)
Glucose, Bld: 179 mg/dL — ABNORMAL HIGH (ref 70–99)
Potassium: 4.2 mmol/L (ref 3.5–5.1)
Sodium: 132 mmol/L — ABNORMAL LOW (ref 135–145)
Total Bilirubin: 0.4 mg/dL (ref 0.3–1.2)
Total Protein: 5.3 g/dL — ABNORMAL LOW (ref 6.5–8.1)

## 2021-02-04 LAB — CBC WITH DIFFERENTIAL/PLATELET
Abs Immature Granulocytes: 0.1 10*3/uL — ABNORMAL HIGH (ref 0.00–0.07)
Basophils Absolute: 0 10*3/uL (ref 0.0–0.1)
Basophils Relative: 0 %
Eosinophils Absolute: 0.1 10*3/uL (ref 0.0–0.5)
Eosinophils Relative: 0 %
HCT: 30.5 % — ABNORMAL LOW (ref 39.0–52.0)
Hemoglobin: 9.5 g/dL — ABNORMAL LOW (ref 13.0–17.0)
Immature Granulocytes: 1 %
Lymphocytes Relative: 15 %
Lymphs Abs: 1.7 10*3/uL (ref 0.7–4.0)
MCH: 26.9 pg (ref 26.0–34.0)
MCHC: 31.1 g/dL (ref 30.0–36.0)
MCV: 86.4 fL (ref 80.0–100.0)
Monocytes Absolute: 0.6 10*3/uL (ref 0.1–1.0)
Monocytes Relative: 6 %
Neutro Abs: 8.7 10*3/uL — ABNORMAL HIGH (ref 1.7–7.7)
Neutrophils Relative %: 78 %
Platelets: 484 10*3/uL — ABNORMAL HIGH (ref 150–400)
RBC: 3.53 MIL/uL — ABNORMAL LOW (ref 4.22–5.81)
RDW: 14.7 % (ref 11.5–15.5)
WBC: 11.2 10*3/uL — ABNORMAL HIGH (ref 4.0–10.5)
nRBC: 0 % (ref 0.0–0.2)

## 2021-02-04 LAB — GLUCOSE, CAPILLARY
Glucose-Capillary: 158 mg/dL — ABNORMAL HIGH (ref 70–99)
Glucose-Capillary: 156 mg/dL — ABNORMAL HIGH (ref 70–99)
Glucose-Capillary: 182 mg/dL — ABNORMAL HIGH (ref 70–99)
Glucose-Capillary: 159 mg/dL — ABNORMAL HIGH (ref 70–99)
Glucose-Capillary: 154 mg/dL — ABNORMAL HIGH (ref 70–99)

## 2021-02-04 NOTE — Progress Notes (Signed)
PROGRESS NOTE  Maurice Hodges  DOB: [birth year]  PCP: [facility] SWN:[id number]  DOA: 02/01/2021  LOS: 3 days   Chief Complaint: Abdominal pain, chills   Brief narrative: Maurice Hodges is a 60 y.o. male with PMH significant for DM2 on metformin.   4/13, patient presented to urgent care for some chills and abdominal discomfort, he was noted to have blood sugar level greater than 600 and directed to ED at [facility].. In the ED, patient was found to be afebrile, hemodynamically stable. CT abdomen showed large hepatic abscess for which he had a percutaneous drain placed by IR on 4/15. Cultures from the abscess grew E. coli and Bacteroides fragilis.  Blood culture grew Bacteroides.  He was started on IV Zosyn.   Apparently there was an issue with the locking system of his abscess drain tube because of which it could not drain and he becomes systemically ill with fever and sweats.  Once they were able to reopen his drain, it immediately drained a lot of purulent material.  In the interim he was switched to IV meropenem.   Repeat CT scan showed additional intra-abdominal abscesses and hence he was transferred to [facility] on 4/20.  Subjective: Patient was seen and examined this morning.  Middle-aged male.  Not in distress.  No new symptoms.  Family is at bedside. Has 3 intra-abdominal drains in place. Remains tachycardic to more than 100. Had 3 episodes of diarrhea last night.   Assessment/Plan: Hepatic abscess -E. coli and bacteroid fragilis Bacteremia with bacteroids fragilis -Initially hospitalized at [facility] (4/13-4/20) with sepsis.  -Transferred to [facility] after abscess did not get better with percutaneous drainage and IV antibiotics. Sepsis has resolved by the time of transfer. -4/15 had first abscess drain placed while at [facility] -4/21, had 2 more drain placed by IR here at [facility].  New cultures sent. -PICC line in place on the right  arm. -ID following. Currently on IV meropenem.  Acute diarrhea -For last 2 to 3 days while in the hospital, 3-4 loose bowel movements in 24 hours. -May be secondary to antibiotics.  Defer to ID for C. difficile assay. -imodium as needed.  Type 2 diabetes mellitus -A1c 14 on 4/13 -Was only on metformin 1000 mg twice daily at home. -Currently Lantus 5 units at bedtime/sliding scale insulin. -Blood sugar trending mostly under 150. -Although his A1c was significantly elevated to 14, he was only requiring only little dose of Lantus to keep blood sugar checked.  I switched him from Lantus to metformin 1000 mg twice daily along with glipizide 2.5 mg daily. -Continue sliding scale insulin with Accu-Cheks. Recent Labs  Lab 02/03/21 1949 02/03/21 2356 02/04/21 0412 02/04/21 0745 02/04/21 1121  GLUCAP 177* 147* 158* 156* 182*   Anemia  -Hemoglobin was reportedly at 10.8 on 4/13, lower at 9.6 on 4/21.  Anemia panel normal as below Recent Labs    02/02/21 0417 02/03/21 0515 02/04/21 0330  HGB 9.6* 9.5* 9.5*  MCV 85.2 84.8 86.4  VITAMINB12  --  1,072*  --   FOLATE  --  18.2  --   FERRITIN  --  1,113*  --   TIBC  --  136*  --   IRON  --  18*  --   RETICCTPCT  --  3.1  --    Hypokalemia/hypomagnesemia  -Potassium level improved with replacement.  Magnesium level was low at 1.6 yesterday.  IV replacement given.  Repeat chemistry tomorrow Recent Labs  Lab 02/02/21 0417 02/03/21 0515 02/04/21 0330  K 3.1* 3.9 4.2  MG  --  1.6*  --   PHOS  --  3.2  --    Hypoalbuminemia -Albumin level severely low at 1.2.  Nutrition consult called.  Mobility: Increase ambulation Code Status:   Code Status: Full Code  Nutritional status: Body mass index is 25.05 kg/m.     Diet Order            Diet Carb Modified Fluid consistency: Thin; Room service appropriate? Yes  Diet effective now               Diabetic diet postprocedure  DVT prophylaxis: SCDs Start: 02/01/21 2000    Antimicrobials:  IV meropenem Fluid: None Consultants: IR, ID Family Communication:  Family at bedside  Status is: Inpatient  Remains inpatient appropriate because: Needs IV antibiotics, procedure, inpatient monitoring  Dispo: The patient is from: Home              Anticipated d/c is to: Home in more than 3 days              Patient currently is not medically stable to d/c.   Difficult to place patient No   Infusions:  . meropenem (MERREM) IV 1 g (02/04/21 0508)    Scheduled Meds: . Chlorhexidine Gluconate Cloth  6 each Topical Daily  . glipiZIDE  2.5 mg Oral Q breakfast  . insulin aspart  0-15 Units Subcutaneous Q4H  . metFORMIN  1,000 mg Oral BID WC  . sodium chloride flush  10-40 mL Intracatheter Q12H  . sodium chloride flush  5 mL Intracatheter Q8H    Antimicrobials: Anti-infectives (From admission, onward)   Start     Dose/Rate Route Frequency Ordered Stop   02/01/21 2200  meropenem (MERREM) 1 g in sodium chloride 0.9 % 100 mL IVPB        1 g 200 mL/hr over 30 Minutes Intravenous Every 8 hours 02/01/21 2004        PRN meds: acetaminophen **OR** acetaminophen, HYDROmorphone (DILAUDID) injection, ondansetron **OR** ondansetron (ZOFRAN) IV, oxyCODONE, polyethylene glycol, sodium chloride flush   Objective: Vitals:   02/03/21 1951 02/04/21 0414  BP: 121/86 131/87  Pulse: (!) 108 (!) 102  Resp: 18 18  Temp: 98.5 F (36.9 C) 98.3 F (36.8 C)  SpO2: 95% 94%    Intake/Output Summary (Last 24 hours) at 02/04/2021 1243 Last data filed at 02/04/2021 1129 Gross per 24 hour  Intake 130 ml  Output 316 ml  Net -186 ml   Filed Weights   02/03/21 1146  Weight: 70.4 kg   Weight change:  Body mass index is 25.05 kg/m.   Physical Exam: General exam: Pleasant, middle-aged male.  Not in distress Skin: No rashes, lesions or ulcers. HEENT: Atraumatic, normocephalic, no obvious bleeding Lungs: Clear to auscultation bilaterally CVS: Regular rate and rhythm, no  murmur GI/Abd soft, has 3 abdominal JP drains in place. CNS: Alert, awake, oriented x3 Psychiatry: Mood appropriate Extremities: No pedal edema, no calf tenderness  Data Review: I have personally reviewed the laboratory data and studies available.  Recent Labs  Lab 02/02/21 0417 02/03/21 0515 02/04/21 0330  WBC 12.0* 9.4 11.2*  NEUTROABS 9.9* 7.3 8.7*  HGB 9.6* 9.5* 9.5*  HCT 30.0* 29.6* 30.5*  MCV 85.2 84.8 86.4  PLT 466* 415* 484*   Recent Labs  Lab 02/02/21 0417 02/03/21 0515 02/04/21 0330  NA 133* 133* 132*  K 3.1* 3.9 4.2  CL  99 98 98  CO2 27 29 29   GLUCOSE 122* 147* 179*  BUN 6 7 10   CREATININE 0.57* 0.50* 0.57*  CALCIUM 7.6* 7.5* 7.5*  MG  --  1.6*  --   PHOS  --  3.2  --     F/u labs ordered Unresulted Labs (From admission, onward)          Start     Ordered   02/05/21 0500  CBC with Differential/Platelet  Daily,   R     Question:  Specimen collection method  Answer:  IV Team=IV Team collect   02/04/21 1243   02/05/21 0500  Basic metabolic panel  Daily,   R     Question:  Specimen collection method  Answer:  IV Team=IV Team collect   02/04/21 1243   02/05/21 0500  Magnesium  Tomorrow morning,   STAT       Question:  Specimen collection method  Answer:  IV Team=IV Team collect   02/04/21 1243   02/05/21 0500  Phosphorus  Tomorrow morning,   R       Question:  Specimen collection method  Answer:  IV Team=IV Team collect   02/04/21 1243   02/02/21 0500  Body fluid culture w Gram Stain  Tomorrow morning,   R       Question:  Are there also cytology or pathology orders on this specimen?  Answer:  No   02/01/21 2004          Signed04/22/22, MD [facility] 02/04/2021

## 2021-02-04 NOTE — Progress Notes (Signed)
Patient ambulatory with assistance to bathroom. IV meropenem given daily via PICC in RUE. JP drains flushed 3x/shift per order. Output measured and documented per protocol. Pain managed with prn oxy. Denies nausea; denies any needs at this time. Two family members at bedside, insulin given, patient ate full dinner tray.

## 2021-02-05 ENCOUNTER — Inpatient Hospital Stay (HOSPITAL_COMMUNITY): Payer: Managed Care, Other (non HMO)

## 2021-02-05 DIAGNOSIS — R9431 Abnormal electrocardiogram [ECG] [EKG]: Secondary | ICD-10-CM

## 2021-02-05 LAB — CBC WITH DIFFERENTIAL/PLATELET
Abs Immature Granulocytes: 0.06 10*3/uL (ref 0.00–0.07)
Basophils Absolute: 0 10*3/uL (ref 0.0–0.1)
Basophils Relative: 1 %
Eosinophils Absolute: 0.1 10*3/uL (ref 0.0–0.5)
Eosinophils Relative: 1 %
HCT: 27.6 % — ABNORMAL LOW (ref 39.0–52.0)
Hemoglobin: 8.7 g/dL — ABNORMAL LOW (ref 13.0–17.0)
Immature Granulocytes: 1 %
Lymphocytes Relative: 17 %
Lymphs Abs: 1.5 10*3/uL (ref 0.7–4.0)
MCH: 27.2 pg (ref 26.0–34.0)
MCHC: 31.5 g/dL (ref 30.0–36.0)
MCV: 86.3 fL (ref 80.0–100.0)
Monocytes Absolute: 0.5 10*3/uL (ref 0.1–1.0)
Monocytes Relative: 5 %
Neutro Abs: 6.7 10*3/uL (ref 1.7–7.7)
Neutrophils Relative %: 75 %
Platelets: 462 10*3/uL — ABNORMAL HIGH (ref 150–400)
RBC: 3.2 MIL/uL — ABNORMAL LOW (ref 4.22–5.81)
RDW: 14.6 % (ref 11.5–15.5)
WBC: 8.8 10*3/uL (ref 4.0–10.5)
nRBC: 0 % (ref 0.0–0.2)

## 2021-02-05 LAB — ECHOCARDIOGRAM COMPLETE
Area-P 1/2: 4.6 cm2
Height: 66 in
S' Lateral: 2.2 cm
Weight: 2483.26 oz

## 2021-02-05 LAB — BASIC METABOLIC PANEL
Anion gap: 6 (ref 5–15)
BUN: 6 mg/dL (ref 6–20)
CO2: 28 mmol/L (ref 22–32)
Calcium: 7.3 mg/dL — ABNORMAL LOW (ref 8.9–10.3)
Chloride: 97 mmol/L — ABNORMAL LOW (ref 98–111)
Creatinine, Ser: 0.51 mg/dL — ABNORMAL LOW (ref 0.61–1.24)
GFR, Estimated: >60 mL/min (ref >60)
Glucose, Bld: 115 mg/dL — ABNORMAL HIGH (ref 70–99)
Potassium: 3.9 mmol/L (ref 3.5–5.1)
Sodium: 131 mmol/L — ABNORMAL LOW (ref 135–145)

## 2021-02-05 LAB — MAGNESIUM: Magnesium: 1.6 mg/dL — ABNORMAL LOW (ref 1.7–2.4)

## 2021-02-05 LAB — PHOSPHORUS: Phosphorus: 3.1 mg/dL (ref 2.5–4.6)

## 2021-02-05 LAB — GLUCOSE, CAPILLARY
Glucose-Capillary: 122 mg/dL — ABNORMAL HIGH (ref 70–99)
Glucose-Capillary: 124 mg/dL — ABNORMAL HIGH (ref 70–99)
Glucose-Capillary: 125 mg/dL — ABNORMAL HIGH (ref 70–99)
Glucose-Capillary: 128 mg/dL — ABNORMAL HIGH (ref 70–99)
Glucose-Capillary: 140 mg/dL — ABNORMAL HIGH (ref 70–99)
Glucose-Capillary: 214 mg/dL — ABNORMAL HIGH (ref 70–99)

## 2021-02-05 NOTE — Progress Notes (Signed)
  Echocardiogram 2D Echocardiogram has been performed.  Yumi Insalaco M 02/05/2021, 9:15 AM 

## 2021-02-05 NOTE — Progress Notes (Signed)
  Echocardiogram 2D Echocardiogram has been performed.  Leta Jungling M 02/05/2021, 9:15 AM

## 2021-02-06 DIAGNOSIS — D649 Anemia, unspecified: Secondary | ICD-10-CM | POA: Diagnosis not present

## 2021-02-06 DIAGNOSIS — E119 Type 2 diabetes mellitus without complications: Secondary | ICD-10-CM

## 2021-02-06 DIAGNOSIS — K651 Peritoneal abscess: Secondary | ICD-10-CM

## 2021-02-06 DIAGNOSIS — R6520 Severe sepsis without septic shock: Secondary | ICD-10-CM | POA: Diagnosis not present

## 2021-02-06 DIAGNOSIS — K750 Abscess of liver: Secondary | ICD-10-CM | POA: Diagnosis not present

## 2021-02-06 LAB — CBC WITH DIFFERENTIAL/PLATELET
Abs Immature Granulocytes: 0.05 10*3/uL (ref 0.00–0.07)
Basophils Absolute: 0.1 10*3/uL (ref 0.0–0.1)
Basophils Relative: 1 %
Eosinophils Absolute: 0 10*3/uL (ref 0.0–0.5)
Eosinophils Relative: 0 %
HCT: 28.1 % — ABNORMAL LOW (ref 39.0–52.0)
Hemoglobin: 8.9 g/dL — ABNORMAL LOW (ref 13.0–17.0)
Immature Granulocytes: 1 %
Lymphocytes Relative: 17 %
Lymphs Abs: 1.4 10*3/uL (ref 0.7–4.0)
MCH: 27.3 pg (ref 26.0–34.0)
MCHC: 31.7 g/dL (ref 30.0–36.0)
MCV: 86.2 fL (ref 80.0–100.0)
Monocytes Absolute: 0.4 10*3/uL (ref 0.1–1.0)
Monocytes Relative: 5 %
Neutro Abs: 6.4 10*3/uL (ref 1.7–7.7)
Neutrophils Relative %: 76 %
Platelets: 489 10*3/uL — ABNORMAL HIGH (ref 150–400)
RBC: 3.26 MIL/uL — ABNORMAL LOW (ref 4.22–5.81)
RDW: 14.6 % (ref 11.5–15.5)
WBC: 8.4 10*3/uL (ref 4.0–10.5)
nRBC: 0 % (ref 0.0–0.2)

## 2021-02-06 LAB — BASIC METABOLIC PANEL
Anion gap: 8 (ref 5–15)
BUN: 5 mg/dL — ABNORMAL LOW (ref 6–20)
CO2: 26 mmol/L (ref 22–32)
Calcium: 7.6 mg/dL — ABNORMAL LOW (ref 8.9–10.3)
Chloride: 96 mmol/L — ABNORMAL LOW (ref 98–111)
Creatinine, Ser: 0.5 mg/dL — ABNORMAL LOW (ref 0.61–1.24)
GFR, Estimated: >60 mL/min (ref >60)
Glucose, Bld: 135 mg/dL — ABNORMAL HIGH (ref 70–99)
Potassium: 3.8 mmol/L (ref 3.5–5.1)
Sodium: 130 mmol/L — ABNORMAL LOW (ref 135–145)

## 2021-02-06 LAB — GLUCOSE, CAPILLARY
Glucose-Capillary: 113 mg/dL — ABNORMAL HIGH (ref 70–99)
Glucose-Capillary: 126 mg/dL — ABNORMAL HIGH (ref 70–99)
Glucose-Capillary: 130 mg/dL — ABNORMAL HIGH (ref 70–99)
Glucose-Capillary: 146 mg/dL — ABNORMAL HIGH (ref 70–99)
Glucose-Capillary: 147 mg/dL — ABNORMAL HIGH (ref 70–99)
Glucose-Capillary: 170 mg/dL — ABNORMAL HIGH (ref 70–99)
Glucose-Capillary: 93 mg/dL (ref 70–99)

## 2021-02-06 MED ORDER — SODIUM CHLORIDE 0.9 % IV SOLN
2.0000 g | INTRAVENOUS | Status: DC
  Administered 2021-02-06 – 2021-02-08 (×3): 2 g via INTRAVENOUS
  Filled 2021-02-06: qty 20
  Filled 2021-02-06 (×2): qty 2
  Filled 2021-02-06: qty 20

## 2021-02-06 MED ORDER — METRONIDAZOLE 500 MG PO TABS
500.0000 mg | ORAL_TABLET | Freq: Three times a day (TID) | ORAL | Status: DC
  Administered 2021-02-06 – 2021-02-07 (×3): 500 mg via ORAL
  Filled 2021-02-06 (×3): qty 1

## 2021-02-06 NOTE — Progress Notes (Signed)
Referring Physician(s): Opyd, Marcial Pacas [facility])  Supervising Physician: Mir, Mauri Reading  Patient Status:  [facility] - In-pt  Chief Complaint: Follow up hepatic abscess drains x 2 and intra-abdominal abscess drain x 1  Subjective:  No complaints today, feels good overall.  Allergies: Patient has no known allergies.  Medications: Prior to Admission medications   Medication Sig Start Date End Date Taking? Authorizing Provider  atorvastatin (LIPITOR) 40 MG tablet Take 40 mg by mouth daily. 11/21/20  Yes [provider]  gabapentin (NEURONTIN) 300 MG capsule Take 1 capsule by mouth 3 (three) times daily. 11/22/20  Yes [provider]  metFORMIN (GLUCOPHAGE) 1000 MG tablet Take 1 tablet by mouth 2 (two) times daily. 11/23/20  Yes [provider]     Vital Signs: BP 101/66 (BP Location: Left Arm)   Pulse 99   Temp 98.2 F (36.8 C) (Oral)   Resp 18   Ht 5\' 6"  (1.676 m) Comment: from 01/25/21 encounter  Wt 155 lb 3.3 oz (70.4 kg) Comment: bedscale  SpO2 96%   BMI 25.05 kg/m   Physical Exam Vitals and nursing note reviewed.  Constitutional:      General: He is not in acute distress. Cardiovascular:     Rate and Rhythm: Normal rate.  Pulmonary:     Effort: Pulmonary effort is normal.  Abdominal:     General: There is no distension.     Palpations: Abdomen is soft.     Tenderness: There is abdominal tenderness (RUQ mild to palpation).     Comments:  Drain labeled #1 (original hepatic abscess drain) to suction with ~20 cc of cloudy tan fluid Drain labeled #2 (RUQ hepatic abscess drain) to suction with ~15 cc hazy serous output Drain labeled #3 (RLQ intra-abdominal abscess drain) to suction with ~15 cc hazy serous output.  Skin:    General: Skin is warm and dry.  Neurological:     Mental Status: He is alert. Mental status is at baseline.     Imaging: ECHOCARDIOGRAM COMPLETE  Result Date: 02/05/2021    ECHOCARDIOGRAM REPORT   Patient Name:   Maurice Hodges  Date of Exam: 02/05/2021 Medical Rec #:  02/07/2021      Height:       66.0 in Accession #:    [id number]     Weight:       155.2 lb Date of Birth:  [birth year]      BSA:          1.795 m Patient Age:    60 years       BP:           120/87 mmHg Patient Gender: M              HR:           101 bpm. Exam Location:  Inpatient Procedure: 2D Echo, Cardiac Doppler and Color Doppler Indications:    Abnormal ECG 794.31 / R94.31  History:        Patient has prior history of Echocardiogram examinations.                 Signs/Symptoms:Fever; Risk Factors:Diabetes. Large hepatic                 abscess grew E. coli and Bacteroides fragilis.  Sonographer:    11/04/1960 RDCS Referring Phys: Leta Jungling [facility] IMPRESSIONS  1. Left ventricular ejection fraction, by estimation, is 60 to 65%. The left ventricle has normal function. The left ventricle  has no regional wall motion abnormalities. Left ventricular diastolic parameters were normal.  2. Right ventricular systolic function is normal. The right ventricular size is normal.  3. Moderate pleural effusion in the left lateral region.  4. The mitral valve is normal in structure. No evidence of mitral valve regurgitation. No evidence of mitral stenosis.  5. The aortic valve has an indeterminant number of cusps. Aortic valve regurgitation is not visualized. Mild aortic valve sclerosis is present, with no evidence of aortic valve stenosis.  6. The inferior vena cava is normal in size with greater than 50% respiratory variability, suggesting right atrial pressure of 3 mmHg. FINDINGS  Left Ventricle: Left ventricular ejection fraction, by estimation, is 60 to 65%. The left ventricle has normal function. The left ventricle has no regional wall motion abnormalities. The left ventricular internal cavity size was normal in size. There is  no left ventricular hypertrophy. Left ventricular diastolic parameters were normal. Right Ventricle: The right ventricular size is normal.Right  ventricular systolic function is normal. Left Atrium: Left atrial size was normal in size. Right Atrium: Right atrial size was normal in size. Pericardium: There is no evidence of pericardial effusion. Mitral Valve: The mitral valve is normal in structure. No evidence of mitral valve regurgitation. No evidence of mitral valve stenosis. Tricuspid Valve: The tricuspid valve is normal in structure. Tricuspid valve regurgitation is trivial. No evidence of tricuspid stenosis. Aortic Valve: The aortic valve has an indeterminant number of cusps. Aortic valve regurgitation is not visualized. Mild aortic valve sclerosis is present, with no evidence of aortic valve stenosis. Pulmonic Valve: The pulmonic valve was not well visualized. Pulmonic valve regurgitation is not visualized. No evidence of pulmonic stenosis. Aorta: The aortic root is normal in size and structure. Venous: The inferior vena cava is normal in size with greater than 50% respiratory variability, suggesting right atrial pressure of 3 mmHg.  Additional Comments: There is a moderate pleural effusion in the left lateral region.  LEFT VENTRICLE PLAX 2D LVIDd:         3.70 cm  Diastology LVIDs:         2.20 cm  LV e' medial:    7.40 cm/s LV PW:         0.80 cm  LV E/e' medial:  7.5 LV IVS:        0.70 cm  LV e' lateral:   12.00 cm/s LVOT diam:     1.90 cm  LV E/e' lateral: 4.6 LV SV:         47 LV SV Index:   26 LVOT Area:     2.84 cm  RIGHT VENTRICLE RV S prime:     17.20 cm/s TAPSE (M-mode): 1.3 cm LEFT ATRIUM             Index       RIGHT ATRIUM          Index LA diam:        2.60 cm 1.45 cm/m  RA Area:     8.27 cm LA Vol (A2C):   28.3 ml 15.76 ml/m RA Volume:   13.10 ml 7.30 ml/m LA Vol (A4C):   24.1 ml 13.42 ml/m LA Biplane Vol: 26.8 ml 14.93 ml/m  AORTIC VALVE LVOT Vmax:   104.00 cm/s LVOT Vmean:  62.100 cm/s LVOT VTI:    0.166 m  AORTA Ao Root diam: 3.00 cm MITRAL VALVE MV Area (PHT): 4.60 cm    SHUNTS MV Decel Time: 165 msec  Systemic VTI:  0.17  m MV E velocity: 55.30 cm/s  Systemic Diam: 1.90 cm MV A velocity: 70.30 cm/s MV E/A ratio:  0.79 Olga Millers MD Electronically signed by Olga Millers MD Signature Date/Time: 02/05/2021/11:08:09 AM    Final     Labs:  CBC: Recent Labs    02/03/21 0515 02/04/21 0330 02/05/21 0343 02/06/21 0328  WBC 9.4 11.2* 8.8 8.4  HGB 9.5* 9.5* 8.7* 8.9*  HCT 29.6* 30.5* 27.6* 28.1*  PLT 415* 484* 462* 489*    COAGS: No results for input(s): INR, APTT in the last 8760 hours.  BMP: Recent Labs    02/03/21 0515 02/04/21 0330 02/05/21 0343 02/06/21 0328  NA 133* 132* 131* 130*  K 3.9 4.2 3.9 3.8  CL 98 98 97* 96*  CO2 29 29 28 26   GLUCOSE 147* 179* 115* 135*  BUN 7 10 6  5*  CALCIUM 7.5* 7.5* 7.3* 7.6*  CREATININE 0.50* 0.57* 0.51* 0.50*  GFRNONAA >60 >60 >60 >60    LIVER FUNCTION TESTS: Recent Labs    02/02/21 0417 02/03/21 0515 02/04/21 0330  BILITOT 0.6 0.5 0.4  AST 16 14* 19  ALT 23 17 18   ALKPHOS 94 76 81  PROT 5.1* 5.1* 5.3*  ALBUMIN 1.2* 1.2* 1.2*    Assessment and Plan:  60 y/o M s/p hepatic abscess drain placed at [facility] on 4/15 with additional hepatic abscess and intra-abdominal abscess drains placed yesterday in IR.   Drain labeled #1 (original hepatic abscess drain) - approximately 20 cc purulent fluid in suction bulb. Per I/O 40 cc output recorded today. Drain labeled #2 (RUQ hepatic abscess drain) - approximately 15 cc hazy serous fluid in suction bulb. Per I/O 30 cc output recorded today. Drain labeled #3 (RLQ intra-abdominal abscess drain) - approximately 20 cc hazy serous fluid in suction bulb. Per I/O 55 cc output recorded today.  Culture of aspirate from second hepatic abscess drain placed 4/21 shows NGTD.  Continue Qshift drain flushes, record output when suction bulbs are emptied, dressing changes every 2-3 days or earlier if soiled. Repeat CT ordered for tomorrow to reassess fluid collection -- IR will follow up with results.  Please call IR  with questions or concerns.  Electronically Signed: [facility], PA-C 02/06/2021, 4:13 PM   I spent a total of 15 Minutes at the the patient's bedside AND on the patient's hospital floor or unit, greater than 50% of which was counseling/coordinating care for hepatic abscess drain x 2 and intra-abdominal abscess drain x 1 follow up.

## 2021-02-06 NOTE — Progress Notes (Signed)
[facility]  Date of Admission:  02/01/2021     CC: Hepatic abscess  Lines: 4/14-c rue triple lumen picc  Abx: 4/25-c ceftriaxone 4/25-c metronidazole  4/20-25 meropenem 4/13-20 piptazo  ASSESSMENT: -Sepsis in setting new abscesses development and locked perc drain  -Polymicrobial hepatic abscesses initial management [facility]; s/p 4/15 perc drain liver abscess placement; cx ecoli/bacteroides -Bacteroides bacteremia 413 in setting liver abscess   60 yo male dm2 admitted 4/20 in transfer from [facility] with percutaneous drain valve shut off and feeling poorly including having fever and new intraabdominal abscesses  4/20 osh ct with improving hepatic abscess but 2 new abscesses (1 is prerectal and 1 is subscapsular right hepatic lobe)  He had undergone here 2 more drains into the 2 other abscesses found on 4/20. The previously placed perc drain valve is opened and functioning now  The 4/21 drain cx is negative.   I agree the septic picture is due to ?accidentally shut off perc drain and source control rather than abx failure   Of note, no obvious stone/biliary obstruction found. Will need age appropriate cancer screening  PLAN: 1. Switch meropenem to ceftriaxone/flagyl 2. If he continues to do well here in the next few days can consider repeat ct make sure the abscesses are getting smaller prior to discharge 3. F/u outside hospital sensitivity 4. Would benefit from colonoscopy outpatient  I spent more than 35 minute reviewing data/chart, and coordinating care and >50% direct face to face time providing counseling/discussing diagnostics/treatment plan with patient    Principal Problem:   Bacterial liver abscess Active Problems:   Uncontrolled type II diabetes mellitus (HCC)   Hypokalemia   AKI (acute kidney injury) (HCC)   Severe sepsis (HCC)   Normocytic anemia   Liver abscess due to bacteria   Abdominal fluid  collection   Central venous catheter in place   No Known Allergies  Scheduled Meds: . Chlorhexidine Gluconate Cloth  6 each Topical Daily  . glipiZIDE  2.5 mg Oral Q breakfast  . insulin aspart  0-15 Units Subcutaneous Q4H  . metFORMIN  1,000 mg Oral BID WC  . metroNIDAZOLE  500 mg Oral Q8H  . sodium chloride flush  10-40 mL Intracatheter Q12H  . sodium chloride flush  5 mL Intracatheter Q8H   Continuous Infusions: . cefTRIAXone (ROCEPHIN)  IV     PRN Meds:.acetaminophen **OR** acetaminophen, HYDROmorphone (DILAUDID) injection, ondansetron **OR** ondansetron (ZOFRAN) IV, oxyCODONE, polyethylene glycol, sodium chloride flush   SUBJECTIVE: Feels well The 3 drains are draining but only changed once a day Diarrhea 2 times per day last 2 days No n/v/rash  No further fever here Wbc trending down  Review of Systems: ROS All other ROS was negative, except mentioned above     OBJECTIVE: Vitals:   02/05/21 1434 02/05/21 1953 02/06/21 0353 02/06/21 1450  BP: 116/75 111/78 109/70 101/66  Pulse: (!) 106 (!) 101 95 99  Resp: 18 18 17 18   Temp: 98.2 F (36.8 C) 98.4 F (36.9 C) 98.6 F (37 C) 98.2 F (36.8 C)  TempSrc: Oral   Oral  SpO2: 96% 96% 95% 96%  Weight:      Height:       Body mass index is 25.05 kg/m.  Physical Exam General/constitutional: no distress, pleasant HEENT: Normocephalic, PER, Conj Clear, EOMI, Oropharynx clear Neck supple CV: rrr no mrg Lungs: clear to auscultation, normal respiratory effort Abd: Soft, Nontender; the 3 drains  is draining slightly murky/purulent fluid Ext: no edema Skin: No Rash Neuro: nonfocal MSK: no peripheral joint swelling/tenderness/warmth; back spines nontender   Central line presence: rue picc (placed osh) site no redness/tenderness   Lab Results Lab Results  Component Value Date   WBC 8.4 02/06/2021   HGB 8.9 (L) 02/06/2021   HCT 28.1 (L) 02/06/2021   MCV 86.2 02/06/2021   PLT 489 (H) 02/06/2021    Lab  Results  Component Value Date   CREATININE 0.50 (L) 02/06/2021   BUN 5 (L) 02/06/2021   NA 130 (L) 02/06/2021   K 3.8 02/06/2021   CL 96 (L) 02/06/2021   CO2 26 02/06/2021    Lab Results  Component Value Date   ALT 18 02/04/2021   AST 19 02/04/2021   ALKPHOS 81 02/04/2021   BILITOT 0.4 02/04/2021      Microbiology: Recent Results (from the past 240 hour(s))  Aerobic/Anaerobic Culture (surgical/deep wound)     Status: None (Preliminary result)   Collection Time: 02/02/21 12:43 PM   Specimen: Abscess; Abdominal Fluid  Result Value Ref Range Status   Specimen Description ABSCESS LIVER  Final   Special Requests DRAINAGE  Final   Gram Stain   Final    ABUNDANT WBC PRESENT,BOTH PMN AND MONONUCLEAR NO ORGANISMS SEEN    Culture   Final    NO GROWTH 4 DAYS NO ANAEROBES ISOLATED; CULTURE IN PROGRESS FOR 5 DAYS Performed at [facility], [address], [place], [place] [address]    Report Status PENDING  Incomplete     Serology: 4/22 acute hepatitis serology negative 4/21 hiv screen negative  Micro: 4/21 perc-drain (new drain) hepatic abscess cx ngtd   Imaging: If present, new imagings (plain films, ct scans, and mri) have been personally visualized and interpreted; radiology reports have been reviewed. Decision making incorporated into the Impression / Recommendations.  4/24 tte 1. Left ventricular ejection fraction, by estimation, is 60 to 65%. The  left ventricle has normal function. The left ventricle has no regional  wall motion abnormalities. Left ventricular diastolic parameters were  normal.  2. Right ventricular systolic function is normal. The right ventricular  size is normal.  3. Moderate pleural effusion in the left lateral region.  4. The mitral valve is normal in structure. No evidence of mitral valve  regurgitation. No evidence of mitral stenosis.  5. The aortic valve has an indeterminant number of cusps. Aortic valve  regurgitation is not  visualized. Mild aortic valve sclerosis is present,  with no evidence of aortic valve stenosis.  6. The inferior vena cava is normal in size with greater than 50%  respiratory variability, suggesting right atrial pressure of 3 mmHg.   4/20 osh ct scan Status post percutaneous drainage catheter placement in right  hepatic abscess. This abscess is significantly smaller compared to  prior exam, currently measuring 6.5 x 6.1 cm.   However, there is interval development of large multilobulated fluid  collection in the pelvis which measures approximately 16 x 8 cm in  the transverse plane and communicates with large fluid collection in  the pre rectal space that measures 7.7 x 6.8 cm. This is concerning  for abscess.   There is also interval development of 7.3 x 5.2 cm subcapsular fluid  collection along the inferior portion of the right hepatic lobe also  concerning for abscess.   4/21 ir guided drains placement here 1. CT-guided placement of a drainage catheter in the right upper abdominal fluid  collection just inferior to the right hepatic lobe. 2. CT-guided placement of a drainage catheter in the right lower abdominal fluid collection. Fortunately, this drain cross the midline and was also decompressing the left lower abdominal fluid collection and the pelvic fluid collection. 3. Hepatic drain is still intact with residual fluid and gas in the complex hepatic abscess.  Raymondo Band, MD [facility] [phone] pager    02/06/2021, 7:26 PM

## 2021-02-06 NOTE — Progress Notes (Signed)
PROGRESS NOTE  Maurice Hodges  DOB: 12-Feb-1961  PCP: [facility] ZDG:[id number]  DOA: 02/01/2021  LOS: 5 days   Chief Complaint: Abdominal pain, chills   Brief narrative: Maurice Hodges is a 60 y.o. male with PMH significant for DM2 on metformin.   4/13, patient presented to urgent care for some chills and abdominal discomfort, he was noted to have blood sugar level greater than 600 and directed to ED at [facility].. In the ED, patient was found to be afebrile, hemodynamically stable. CT abdomen showed large hepatic abscess for which he had a percutaneous drain placed by IR on 4/15. Cultures from the abscess grew E. coli and Bacteroides fragilis.  Blood culture grew Bacteroides.  He was started on IV Zosyn.   Apparently there was an issue with the locking system of his abscess drain tube because of which it could not drain and he becomes systemically ill with fever and sweats.  Once they were able to reopen his drain, it immediately drained a lot of purulent material.  In the interim he was switched to IV meropenem.   Repeat CT scan showed additional intra-abdominal abscesses and hence he was [facility].  Subjective: Patient was seen and examined this morning.  Middle-aged male.  Not in distress.  No new symptoms.  Family is at bedside. Has 3 intra-abdominal drains in place. Tachycardia improved.  Diarrhea improved.  Assessment/Plan: Hepatic abscess -E. coli and bacteroid fragilis Bacteremia with bacteroids fragilis -Initially hospitalized at [facility] (4/13-4/20) with sepsis.  -Transferred to [facility] after abscess did not get better with percutaneous drainage and IV antibiotics. Sepsis has resolved by the time of transfer. -4/15 had first abscess drain placed while at [facility] -4/21, had 2 more drain placed by IR here at [facility].  New cultures sent. -PICC line in place on the right arm. -ID following. Currently on IV  meropenem.  Acute diarrhea -Only 1 episode of bowel movement last 24 hours per patient. -continue imodium as needed.  Type 2 diabetes mellitus -A1c 14 on 4/13 -Was only on metformin 1000 mg twice daily at home. -Currently Lantus 5 units at bedtime/sliding scale insulin. -Blood sugar trending mostly under 150. -Although his A1c was significantly elevated to 14, for the first few days in hospital, he was only requiring only little dose of Lantus to keep blood sugar checked.  I switched him from Lantus to metformin 1000 mg twice daily along with glipizide 2.5 mg daily.  Blood sugar level remains stable under 150 without any insulin. -Continue sliding scale insulin with Accu-Cheks. Recent Labs  Lab 02/05/21 1951 02/06/21 0005 02/06/21 0351 02/06/21 0725 02/06/21 1136  GLUCAP 128* 93 126* 130* 147*   Anemia  -Hemoglobin was reportedly at 10.8 on 4/13, lower at 9.6 on 4/21.  Anemia panel normal as below Recent Labs    02/02/21 0417 02/03/21 0515 02/04/21 0330 02/05/21 0343 02/06/21 0328  HGB 9.6* 9.5* 9.5* 8.7* 8.9*  MCV 85.2 84.8 86.4 86.3 86.2  VITAMINB12  --  1,072*  --   --   --   FOLATE  --  18.2  --   --   --   FERRITIN  --  1,113*  --   --   --   TIBC  --  136*  --   --   --   IRON  --  18*  --   --   --   RETICCTPCT  --  3.1  --   --   --  Hypokalemia/hypomagnesemia  -Potassium and magnesium level checked and replaced. Recent Labs  Lab 02/02/21 0417 02/03/21 0515 02/04/21 0330 02/05/21 0343 02/06/21 0328  K 3.1* 3.9 4.2 3.9 3.8  MG  --  1.6*  --  1.6*  --   PHOS  --  3.2  --  3.1  --    Hypoalbuminemia -Albumin level severely low at 1.2.  Nutrition consult called.  Mobility: Increase ambulation Code Status:   Code Status: Full Code  Nutritional status: Body mass index is 25.05 kg/m.     Diet Order            Diet Carb Modified Fluid consistency: Thin; Room service appropriate? Yes  Diet effective now               Diabetic diet  postprocedure  DVT prophylaxis: SCDs Start: 02/01/21 2000   Antimicrobials:  IV meropenem Fluid: None Consultants: IR, ID Family Communication:  Family at bedside  Status is: Inpatient  Remains inpatient appropriate because: Needs IV antibiotics, procedure, inpatient monitoring  Dispo: The patient is from: Home              Anticipated d/c is to: Home in 2 to 3 days.              Patient currently is not medically stable to d/c.   Difficult to place patient No   Infusions:  . cefTRIAXone (ROCEPHIN)  IV      Scheduled Meds: . Chlorhexidine Gluconate Cloth  6 each Topical Daily  . glipiZIDE  2.5 mg Oral Q breakfast  . insulin aspart  0-15 Units Subcutaneous Q4H  . metFORMIN  1,000 mg Oral BID WC  . metroNIDAZOLE  500 mg Oral Q8H  . sodium chloride flush  10-40 mL Intracatheter Q12H  . sodium chloride flush  5 mL Intracatheter Q8H    Antimicrobials: Anti-infectives (From admission, onward)   Start     Dose/Rate Route Frequency Ordered Stop   02/06/21 2200  cefTRIAXone (ROCEPHIN) 2 g in sodium chloride 0.9 % 100 mL IVPB        2 g 200 mL/hr over 30 Minutes Intravenous Every 24 hours 02/06/21 1446     02/06/21 2200  metroNIDAZOLE (FLAGYL) tablet 500 mg        500 mg Oral Every 8 hours 02/06/21 1446     02/01/21 2200  meropenem (MERREM) 1 g in sodium chloride 0.9 % 100 mL IVPB  Status:  Discontinued        1 g 200 mL/hr over 30 Minutes Intravenous Every 8 hours 02/01/21 2004 02/06/21 1446      PRN meds: acetaminophen **OR** acetaminophen, HYDROmorphone (DILAUDID) injection, ondansetron **OR** ondansetron (ZOFRAN) IV, oxyCODONE, polyethylene glycol, sodium chloride flush   Objective: Vitals:   02/06/21 0353 02/06/21 1450  BP: 109/70 101/66  Pulse: 95 99  Resp: 17 18  Temp: 98.6 F (37 C) 98.2 F (36.8 C)  SpO2: 95% 96%    Intake/Output Summary (Last 24 hours) at 02/06/2021 1526 Last data filed at 02/06/2021 1451 Gross per 24 hour  Intake 1070 ml  Output 1644  ml  Net -574 ml   Filed Weights   02/03/21 1146  Weight: 70.4 kg   Weight change:  Body mass index is 25.05 kg/m.   Physical Exam: General exam: Pleasant, middle-aged male.  Not in distress Skin: No rashes, lesions or ulcers. HEENT: Atraumatic, normocephalic, no obvious bleeding Lungs: Clear to auscultation bilaterally CVS: Regular rate and rhythm, no murmur GI/Abd  soft, has 3 abdominal JP drains in place.  Slight tenderness on the right upper quadrant as expected CNS: Alert, awake, oriented x3 Psychiatry: Mood appropriate Extremities: No pedal edema, no calf tenderness  Data Review: I have personally reviewed the laboratory data and studies available.  Recent Labs  Lab 02/02/21 0417 02/03/21 0515 02/04/21 0330 02/05/21 0343 02/06/21 0328  WBC 12.0* 9.4 11.2* 8.8 8.4  NEUTROABS 9.9* 7.3 8.7* 6.7 6.4  HGB 9.6* 9.5* 9.5* 8.7* 8.9*  HCT 30.0* 29.6* 30.5* 27.6* 28.1*  MCV 85.2 84.8 86.4 86.3 86.2  PLT 466* 415* 484* 462* 489*   Recent Labs  Lab 02/02/21 0417 02/03/21 0515 02/04/21 0330 02/05/21 0343 02/06/21 0328  NA 133* 133* 132* 131* 130*  K 3.1* 3.9 4.2 3.9 3.8  CL 99 98 98 97* 96*  CO2 27 29 29 28 26   GLUCOSE 122* 147* 179* 115* 135*  BUN 6 7 10 6  5*  CREATININE 0.57* 0.50* 0.57* 0.51* 0.50*  CALCIUM 7.6* 7.5* 7.5* 7.3* 7.6*  MG  --  1.6*  --  1.6*  --   PHOS  --  3.2  --  3.1  --     F/u labs ordered Unresulted Labs (From admission, onward)          Start     Ordered   02/05/21 0500  CBC with Differential/Platelet  Daily,   R     Question:  Specimen collection method  Answer:  IV Team=IV Team collect   02/04/21 1243   02/05/21 0500  Basic metabolic panel  Daily,   R     Question:  Specimen collection method  Answer:  IV Team=IV Team collect   02/04/21 1243   02/02/21 0500  Body fluid culture w Gram Stain  Tomorrow morning,   R       Question:  Are there also cytology or pathology orders on this specimen?  Answer:  No   02/01/21 2004           Signed04/22/22, MD [facility] 02/06/2021

## 2021-02-06 NOTE — TOC Initial Note (Signed)
Transition of Care [facility]) - Initial/Assessment Note    Patient Details  Name: Maurice Hodges MRN: [id number] Date of Birth: 12/04/1960  [facility] CM/SW Contact:    Kingsley Plan, RN Phone Number: 02/06/2021, 4:42 PM  Clinical Narrative:                  Spoke to patient, daughter at bedside and patient's brother Tio Jose via speaker phone.    Discussed IV ABX at home. Explained nurse will not be present every time a dose is due. Pam with [facility] will provide teaching to patient and daughter prior to discharge .   NCM confirmed face sheet information . NCM exhausted list of agencies for address. Pam will arrange [facility] with [facility], however [facility] will only be for PICC Line and IV ABX , not drains. Hospital nurse will provide drain care teaching prior to discharge.  All three voiced understanding.    Pearson Grippe with [facility] declined referral due to staffing.   Eber Jones with [facility] does not cover address.   Elnita Maxwell with [facility] is not in network with patient's insurance.   Cory with [facility] is not in network with patient's insurance.   Marylene Land with [facility] is not in network with patient's insurance.   Misty Stanley with [facility] is not in network with patient's insurance.   Stacie with [facility] is not in network with patient's insurance.  Expected Discharge Plan: Home w Home Health Services     Patient Goals and CMS Choice Patient states their goals for this hospitalization and ongoing recovery are:: to go home CMS Medicare.gov Compare Post Acute Care list provided to:: Patient Choice offered to / list presented to : Patient  Expected Discharge Plan and Services Expected Discharge Plan: Home w Home Health Services     Post Acute Care Choice: Home Health Living arrangements for the past 2 months: Single Family Home                   DME Agency: NA                  Prior Living Arrangements/Services Living  arrangements for the past 2 months: Single Family Home Lives with:: Adult Children,Spouse Patient language and need for interpreter reviewed:: Yes Do you feel safe going back to the place where you live?: Yes      Need for Family Participation in Patient Care: Yes (Comment) Care giver support system in place?: Yes (comment)   Criminal Activity/Legal Involvement Pertinent to Current Situation/Hospitalization: No - Comment as needed  Activities of Daily Living      Permission Sought/Granted   Permission granted to share information with : Yes, Verbal Permission Granted              Emotional Assessment Appearance:: Appears stated age     Orientation: : Oriented to Situation,Oriented to  Time,Oriented to Place,Oriented to Self Alcohol / Substance Use: Not Applicable Psych Involvement: No (comment)  Admission diagnosis:  Abscess of abdominal cavity (HCC) [K65.1] Liver abscess due to bacteria [K75.0, B96.89] Patient Active Problem List   Diagnosis Date Noted  . Abdominal fluid collection   . Central venous catheter in place   . Bacterial liver abscess 02/01/2021  . Uncontrolled type II diabetes mellitus (HCC) 02/01/2021  . Hypokalemia 02/01/2021  . AKI (acute kidney injury) (HCC) 02/01/2021  . Severe sepsis (HCC) 02/01/2021  . Normocytic anemia 02/01/2021  . Liver abscess due to bacteria 02/01/2021  PCP:  [facility]:   [facility] [address], [place] - [address] [place] [address] Phone: [phone] Fax: [phone]     Social Determinants of Health (SDOH) Interventions    Readmission Risk Interventions No flowsheet data found.

## 2021-02-07 ENCOUNTER — Inpatient Hospital Stay (HOSPITAL_COMMUNITY): Payer: Managed Care, Other (non HMO)

## 2021-02-07 DIAGNOSIS — E876 Hypokalemia: Secondary | ICD-10-CM | POA: Diagnosis not present

## 2021-02-07 DIAGNOSIS — E119 Type 2 diabetes mellitus without complications: Secondary | ICD-10-CM | POA: Diagnosis not present

## 2021-02-07 DIAGNOSIS — K651 Peritoneal abscess: Secondary | ICD-10-CM | POA: Diagnosis not present

## 2021-02-07 DIAGNOSIS — K750 Abscess of liver: Secondary | ICD-10-CM | POA: Diagnosis not present

## 2021-02-07 LAB — CBC WITH DIFFERENTIAL/PLATELET
Abs Immature Granulocytes: 0.05 10*3/uL (ref 0.00–0.07)
Basophils Absolute: 0.1 10*3/uL (ref 0.0–0.1)
Basophils Relative: 1 %
Eosinophils Absolute: 0 10*3/uL (ref 0.0–0.5)
Eosinophils Relative: 1 %
HCT: 27.5 % — ABNORMAL LOW (ref 39.0–52.0)
Hemoglobin: 8.7 g/dL — ABNORMAL LOW (ref 13.0–17.0)
Immature Granulocytes: 1 %
Lymphocytes Relative: 24 %
Lymphs Abs: 1.9 10*3/uL (ref 0.7–4.0)
MCH: 26.9 pg (ref 26.0–34.0)
MCHC: 31.6 g/dL (ref 30.0–36.0)
MCV: 84.9 fL (ref 80.0–100.0)
Monocytes Absolute: 0.4 10*3/uL (ref 0.1–1.0)
Monocytes Relative: 5 %
Neutro Abs: 5.5 10*3/uL (ref 1.7–7.7)
Neutrophils Relative %: 68 %
Platelets: 502 10*3/uL — ABNORMAL HIGH (ref 150–400)
RBC: 3.24 MIL/uL — ABNORMAL LOW (ref 4.22–5.81)
RDW: 14.5 % (ref 11.5–15.5)
WBC: 7.9 10*3/uL (ref 4.0–10.5)
nRBC: 0 % (ref 0.0–0.2)

## 2021-02-07 LAB — AEROBIC/ANAEROBIC CULTURE W GRAM STAIN (SURGICAL/DEEP WOUND): Culture: NO GROWTH

## 2021-02-07 LAB — BASIC METABOLIC PANEL
Anion gap: 5 (ref 5–15)
BUN: 7 mg/dL (ref 6–20)
CO2: 27 mmol/L (ref 22–32)
Calcium: 7.6 mg/dL — ABNORMAL LOW (ref 8.9–10.3)
Chloride: 99 mmol/L (ref 98–111)
Creatinine, Ser: 0.55 mg/dL — ABNORMAL LOW (ref 0.61–1.24)
GFR, Estimated: >60 mL/min (ref >60)
Glucose, Bld: 159 mg/dL — ABNORMAL HIGH (ref 70–99)
Potassium: 3.8 mmol/L (ref 3.5–5.1)
Sodium: 131 mmol/L — ABNORMAL LOW (ref 135–145)

## 2021-02-07 LAB — GLUCOSE, CAPILLARY
Glucose-Capillary: 106 mg/dL — ABNORMAL HIGH (ref 70–99)
Glucose-Capillary: 119 mg/dL — ABNORMAL HIGH (ref 70–99)
Glucose-Capillary: 120 mg/dL — ABNORMAL HIGH (ref 70–99)
Glucose-Capillary: 127 mg/dL — ABNORMAL HIGH (ref 70–99)
Glucose-Capillary: 143 mg/dL — ABNORMAL HIGH (ref 70–99)
Glucose-Capillary: 153 mg/dL — ABNORMAL HIGH (ref 70–99)
Glucose-Capillary: 160 mg/dL — ABNORMAL HIGH (ref 70–99)

## 2021-02-07 MED ORDER — METRONIDAZOLE 500 MG PO TABS
500.0000 mg | ORAL_TABLET | Freq: Two times a day (BID) | ORAL | Status: DC
  Administered 2021-02-07 – 2021-02-08 (×2): 500 mg via ORAL
  Filled 2021-02-07 (×2): qty 1

## 2021-02-07 MED ORDER — IOHEXOL 300 MG/ML  SOLN
100.0000 mL | Freq: Once | INTRAMUSCULAR | Status: AC | PRN
  Administered 2021-02-07: 100 mL via INTRAVENOUS

## 2021-02-07 NOTE — Progress Notes (Signed)
[place] for Infectious Disease  Date of Admission:  02/01/2021     CC: Hepatic abscess  Lines: 4/14-c rue triple lumen picc  Abx: 4/25-c ceftriaxone 4/25-c metronidazole  4/20-25 meropenem 4/13-20 piptazo  ASSESSMENT: -intraabd abscesses with severe sepsis -Polymicrobial hepatic abscesses initial management [facility]; s/p 4/15 perc drain liver abscess placement; cx ecoli/bacteroides -Bacteroides bacteremia 4/13 in setting liver abscess   60 yo male dm2 admitted 4/20 in transfer from [facility] with percutaneous drain valve shut off and feeling poorly including having fever and new intraabdominal abscesses  4/20 osh ct with improving hepatic abscess but 2 new abscesses (1 is prerectal and 1 is subscapsular right hepatic lobe)  He had undergone here 2 more drains into the 2 other abscesses found on 4/20. The previously placed perc drain valve is opened and functioning now  The 4/21 drain cx is negative.   I agree the septic picture is due to ?accidentally shut off perc drain and source control rather than abx failure   Of note, no obvious stone/biliary obstruction found. Will need age appropriate cancer screening  -------- 4/26 assessment Continues to do well after switch from meropenem to ceftriaxone yesterday Outside hospital culture continues to not be able to grow the [facility] cx here negative  Clinically doing much better. Repeat ct 4/26 showed right hepatic fluid collection in continuity with previous but increased size ??? Loculation. Pending IR reevaluation  Reasonable to arrange opat and plan outpatient f/u with ID, once IR reevaluated  PLAN: 1. Continue ceftriaxone 2 gram iv daily; and PO metronidazole (can do twice daily dosing) 500 mg 2. Will discuss with our pharmacy team to set up opat 3. Plan 6 weeks of the above antibiotics, with further reevaluation during clinic f/u 4. Will need ct abd pelvis with contrast in around 3  weeks; will arrange once seen in clinic 5. ID can f/u outside hospital sensitivity if it ever grows 6. Would benefit from colonoscopy outpatient 7.   Discussed plan with patient/his family/primary team    [facility] Care Per Protocol:  Home health RN for IV administration and teaching; PICC line care and labs.    Labs weekly while on IV antibiotics: _x_ CBC with differential __ BMP _x_ CMP _x_ CRP __ ESR __ Vancomycin trough __ CK  _x_ Please pull PIC at completion of IV antibiotics __ Please leave PIC in place until doctor has seen patient or been notified  Fax weekly labs to [phone]  Clinic Follow Up Appt: 5/17 @ 145 with dr Leanza Shepperson  @  [facility] [address], [place], [place] [address] Phone: [phone]    I spent more than 35 minute reviewing data/chart, and coordinating care and >50% direct face to face time providing counseling/discussing diagnostics/treatment plan with patient    Principal Problem:   Bacterial liver abscess Active Problems:   Uncontrolled type II diabetes mellitus ([place])   Hypokalemia   AKI (acute kidney injury) ([place])   Severe sepsis ([place])   Normocytic anemia   Liver abscess due to bacteria   Abdominal fluid collection   Central venous catheter in place   Intra-abdominal abscess ([place])   No Known Allergies  Scheduled Meds: . Chlorhexidine Gluconate Cloth  6 each Topical Daily  . glipiZIDE  2.5 mg Oral Q breakfast  . insulin aspart  0-15 Units Subcutaneous Q4H  . metFORMIN  1,000 mg Oral BID WC  . metroNIDAZOLE  500 mg Oral  Q8H  . sodium chloride flush  10-40 mL Intracatheter Q12H  . sodium chloride flush  5 mL Intracatheter Q8H   Continuous Infusions: . cefTRIAXone (ROCEPHIN)  IV 2 g (02/06/21 2049)   PRN Meds:.acetaminophen **OR** acetaminophen, HYDROmorphone (DILAUDID) injection, ondansetron **OR** ondansetron (ZOFRAN) IV, oxyCODONE, polyethylene glycol, sodium chloride flush   SUBJECTIVE: Afebrile Feels well;  happy Eats well No diarrhea No n/v No rash  Drains continue to function as expected; changed once a day  Review of Systems: ROS All other ROS was negative, except mentioned above     OBJECTIVE: Vitals:   02/06/21 1450 02/06/21 2002 02/07/21 0402 02/07/21 1307  BP: 101/66 106/70 111/73 117/82  Pulse: 99 (!) 104 93 (!) 101  Resp: '18 18 17   ' Temp: 98.2 F (36.8 C) 98.5 F (36.9 C) 98.6 F (37 C) 98.2 F (36.8 C)  TempSrc: Oral Oral Oral Oral  SpO2: 96% 95% 95% 98%  Weight:      Height:       Body mass index is 25.05 kg/m.  Physical Exam General/constitutional: no distress, pleasant HEENT: Normocephalic, PER, Conj Clear, EOMI, Oropharynx clear Neck supple CV: rrr no mrg Lungs: clear to auscultation, normal respiratory effort Abd: Soft, Nontender; 3 drains draining purulent fluid ruq Ext: no edema Skin: No Rash Neuro: nonfocal MSK: no peripheral joint swelling/tenderness/warmth; back spines nontender   Central line presence: rue picc site no tenderness/purulence    Lab Results Lab Results  Component Value Date   WBC 7.9 02/07/2021   HGB 8.7 (L) 02/07/2021   HCT 27.5 (L) 02/07/2021   MCV 84.9 02/07/2021   PLT 502 (H) 02/07/2021    Lab Results  Component Value Date   CREATININE 0.55 (L) 02/07/2021   BUN 7 02/07/2021   NA 131 (L) 02/07/2021   K 3.8 02/07/2021   CL 99 02/07/2021   CO2 27 02/07/2021    Lab Results  Component Value Date   ALT 18 02/04/2021   AST 19 02/04/2021   ALKPHOS 81 02/04/2021   BILITOT 0.4 02/04/2021      Microbiology: Recent Results (from the past 240 hour(s))  Aerobic/Anaerobic Culture (surgical/deep wound)     Status: None   Collection Time: 02/02/21 12:43 PM   Specimen: Abscess; Abdominal Fluid  Result Value Ref Range Status   Specimen Description ABSCESS LIVER  Final   Special Requests DRAINAGE  Final   Gram Stain   Final    ABUNDANT WBC PRESENT,BOTH PMN AND MONONUCLEAR NO ORGANISMS SEEN    Culture   Final     No growth aerobically or anaerobically. Performed at [facility], [place] [address] [place], [place] [address]    Report Status 02/07/2021 FINAL  Final     Serology: 4/22 acute hepatitis serology negative 4/21 hiv screen negative  Micro: 4/21 perc-drain (new drain) hepatic abscess cx ngtd   Imaging: If present, new imagings (plain films, ct scans, and mri) have been personally visualized and interpreted; radiology reports have been reviewed. Decision making incorporated into the Impression / Recommendations.  4/26 ct abd pelv Interval percutaneous drainage of the dominant pelvic fluid collection with near complete evacuation of this collection. Thin pancake like residual collection persists.  Interval percutaneous drainage of the right subhepatic fluid collection with residual 5.4 cm rim enhancing collection remaining in continuity with the percutaneous drainage catheter.  Interval development of a recurrent 7.7 cm right hepatic abscess. While this appears in continuity with the percutaneous drainage catheter, its interval increase in  size suggests either loculation and exclusion from the drainage catheter or malfunction of the drainage catheter. Clinical correlation is advised.  Persistent changes of peritonitis.  Anasarca with stable bilateral pleural effusions and ascites but increasing diffuse subcutaneous body wall edema and retroperitoneal edema.  4/24 tte 1. Left ventricular ejection fraction, by estimation, is 60 to 65%. The  left ventricle has normal function. The left ventricle has no regional  wall motion abnormalities. Left ventricular diastolic parameters were  normal.  2. Right ventricular systolic function is normal. The right ventricular  size is normal.  3. Moderate pleural effusion in the left lateral region.  4. The mitral valve is normal in structure. No evidence of mitral valve  regurgitation. No evidence of mitral stenosis.  5. The  aortic valve has an indeterminant number of cusps. Aortic valve  regurgitation is not visualized. Mild aortic valve sclerosis is present,  with no evidence of aortic valve stenosis.  6. The inferior vena cava is normal in size with greater than 50%  respiratory variability, suggesting right atrial pressure of 3 mmHg.   4/20 osh ct scan Status post percutaneous drainage catheter placement in right  hepatic abscess. This abscess is significantly smaller compared to  prior exam, currently measuring 6.5 x 6.1 cm.   However, there is interval development of large multilobulated fluid  collection in the pelvis which measures approximately 16 x 8 cm in  the transverse plane and communicates with large fluid collection in  the pre rectal space that measures 7.7 x 6.8 cm. This is concerning  for abscess.   There is also interval development of 7.3 x 5.2 cm subcapsular fluid  collection along the inferior portion of the right hepatic lobe also  concerning for abscess.   4/21 ir guided drains placement here 1. CT-guided placement of a drainage catheter in the right upper abdominal fluid collection just inferior to the right hepatic lobe. 2. CT-guided placement of a drainage catheter in the right lower abdominal fluid collection. Fortunately, this drain cross the midline and was also decompressing the left lower abdominal fluid collection and the pelvic fluid collection. 3. Hepatic drain is still intact with residual fluid and gas in the complex hepatic abscess.  Jabier Mutton, [place] [facility] [place] [phone] pager    02/07/2021, 4:21 PM

## 2021-02-07 NOTE — Progress Notes (Signed)
PROGRESS NOTE  Maurice Hodges  DOB: [birth year]  PCP: [facility] KXF:[id number]  DOA: 02/01/2021  LOS: 6 days   Chief Complaint: Abdominal pain, chills   Brief narrative: Maurice Hodges is a 60 y.o. male with PMH significant for DM2 on metformin.   4/13, patient presented to urgent care for some chills and abdominal discomfort, he was noted to have blood sugar level greater than 600 and directed to ED at [facility].. In the ED, patient was found to be afebrile, hemodynamically stable. CT abdomen showed large hepatic abscess for which he had a percutaneous drain placed by IR on 4/15. Cultures from the abscess grew E. coli and Bacteroides fragilis.  Blood culture grew Bacteroides.  He was started on IV Zosyn.   Apparently there was an issue with the locking system of his abscess drain tube because of which it could not drain and he becomes systemically ill with fever and sweats.  Once they were able to reopen his drain, it immediately drained a lot of purulent material.  In the interim he was switched to IV meropenem.   Repeat CT scan showed additional intra-abdominal abscesses and hence he was transferred to [facility] on 4/20.  Subjective: Patient was seen and examined this morning.   Lying on bed.  Not in distress.  No fever, diarrhea controlled.   -Continues to have purulent drainage from all 3 abdominal drains.    Assessment/Plan: Hepatic abscess -E. coli and bacteroid fragilis Bacteremia with bacteroids fragilis -Initially hospitalized at [facility] (4/13-4/20) with sepsis.  -Transferred to [facility] after abscess did not get better with percutaneous drainage and IV antibiotics. Sepsis has resolved by the time of transfer. -4/15 had first abscess drain placed while at [facility] -4/21, had 2 more drain placed by IR here at [facility].  New cultures did not show any growth. -4/26, patient underwent repeat abdominal CT. Near complete evacuation of  the pelvic collection but there is an interval development of her recurrent 7.7 cm right hepatic abscess.  Defer to IR for manipulation of drain versus new drain placement. -PICC line in place on the right arm. ID following.  Switched from IV meropenem to IV Rocephin and Flagyl yesterday.  Acute diarrhea -Improved with as needed Imodium.  Had a regular bowel movement yesterday.  Type 2 diabetes mellitus -A1c 14 on 4/13 -Was only on metformin 1000 mg twice daily at home. -Currently blood sugar is controlled mostly under 150 on metformin 1000 mg twice daily and glipizide 2.5 mg daily.  Despite high A1c, he is not requiring insulin treatment. Recent Labs  Lab 02/06/21 1958 02/06/21 2347 02/07/21 0344 02/07/21 0357 02/07/21 0737  GLUCAP 170* 113* 160* 153* 143*   Anemia  -Hemoglobin was reportedly at 10.8 on 4/13, lower at 9.6 on 4/21.  Currently stable between 8 and 9 for last 3 days. Anemia panel normal as below Recent Labs    02/03/21 0515 02/04/21 0330 02/05/21 0343 02/06/21 0328 02/07/21 0343  HGB 9.5* 9.5* 8.7* 8.9* 8.7*  MCV 84.8 86.4 86.3 86.2 84.9  VITAMINB12 1,072*  --   --   --   --   FOLATE 18.2  --   --   --   --   FERRITIN 1,113*  --   --   --   --   TIBC 136*  --   --   --   --   IRON 18*  --   --   --   --  RETICCTPCT 3.1  --   --   --   --    Hypokalemia/hypomagnesemia  -Potassium and magnesium level checked and replaced. Recent Labs  Lab 02/03/21 0515 02/04/21 0330 02/05/21 0343 02/06/21 0328 02/07/21 0343  K 3.9 4.2 3.9 3.8 3.8  MG 1.6*  --  1.6*  --   --   PHOS 3.2  --  3.1  --   --    Hypoalbuminemia Generalized anasarca upper CT scan -Albumin level severely low at 1.2. Nutrition consult appreciated.  Mobility: Encourage ambulation Code Status:   Code Status: Full Code  Nutritional status: Body mass index is 25.05 kg/m.     Diet Order            Diet Carb Modified Fluid consistency: Thin; Room service appropriate? Yes  Diet effective  now               Diabetic diet postprocedure  DVT prophylaxis: SCDs Start: 02/01/21 2000   Antimicrobials:  IV meropenem Fluid: None Consultants: IR, ID Family Communication:  Family at bedside  Status is: Inpatient  Remains inpatient appropriate because: Needs IV antibiotics, procedure, inpatient monitoring  Dispo: The patient is from: Home              Anticipated d/c is to: Home in 2 to 3 days.              Patient currently is not medically stable to d/c.   Difficult to place patient No   Infusions:  . cefTRIAXone (ROCEPHIN)  IV 2 g (02/06/21 2049)    Scheduled Meds: . Chlorhexidine Gluconate Cloth  6 each Topical Daily  . glipiZIDE  2.5 mg Oral Q breakfast  . insulin aspart  0-15 Units Subcutaneous Q4H  . metFORMIN  1,000 mg Oral BID WC  . metroNIDAZOLE  500 mg Oral Q8H  . sodium chloride flush  10-40 mL Intracatheter Q12H  . sodium chloride flush  5 mL Intracatheter Q8H    Antimicrobials: Anti-infectives (From admission, onward)   Start     Dose/Rate Route Frequency Ordered Stop   02/06/21 2200  cefTRIAXone (ROCEPHIN) 2 g in sodium chloride 0.9 % 100 mL IVPB        2 g 200 mL/hr over 30 Minutes Intravenous Every 24 hours 02/06/21 1446     02/06/21 2200  metroNIDAZOLE (FLAGYL) tablet 500 mg        500 mg Oral Every 8 hours 02/06/21 1446     02/01/21 2200  meropenem (MERREM) 1 g in sodium chloride 0.9 % 100 mL IVPB  Status:  Discontinued        1 g 200 mL/hr over 30 Minutes Intravenous Every 8 hours 02/01/21 2004 02/06/21 1446      PRN meds: acetaminophen **OR** acetaminophen, HYDROmorphone (DILAUDID) injection, ondansetron **OR** ondansetron (ZOFRAN) IV, oxyCODONE, polyethylene glycol, sodium chloride flush   Objective: Vitals:   02/06/21 2002 02/07/21 0402  BP: 106/70 111/73  Pulse: (!) 104 93  Resp: 18 17  Temp: 98.5 F (36.9 C) 98.6 F (37 C)  SpO2: 95% 95%    Intake/Output Summary (Last 24 hours) at 02/07/2021 1034 Last data filed at  02/07/2021 0659 Gross per 24 hour  Intake 627 ml  Output 894 ml  Net -267 ml   Filed Weights   02/03/21 1146  Weight: 70.4 kg   Weight change:  Body mass index is 25.05 kg/m.   Physical Exam: General exam: Pleasant, middle-aged male.  Not in distress Skin: No  rashes, lesions or ulcers. HEENT: Atraumatic, normocephalic, no obvious bleeding Lungs: Clear to auscultation bilaterally CVS: Regular rate and rhythm, no murmur GI/Abd soft, has 3 abdominal JP drains in place.  Slight tenderness on the right upper quadrant as expected CNS: Alert, awake, oriented x3 Psychiatry: Mood appropriate Extremities: No pedal edema, no calf tenderness  Data Review: I have personally reviewed the laboratory data and studies available.  Recent Labs  Lab 02/03/21 0515 02/04/21 0330 02/05/21 0343 02/06/21 0328 02/07/21 0343  WBC 9.4 11.2* 8.8 8.4 7.9  NEUTROABS 7.3 8.7* 6.7 6.4 5.5  HGB 9.5* 9.5* 8.7* 8.9* 8.7*  HCT 29.6* 30.5* 27.6* 28.1* 27.5*  MCV 84.8 86.4 86.3 86.2 84.9  PLT 415* 484* 462* 489* 502*   Recent Labs  Lab 02/03/21 0515 02/04/21 0330 02/05/21 0343 02/06/21 0328 02/07/21 0343  NA 133* 132* 131* 130* 131*  K 3.9 4.2 3.9 3.8 3.8  CL 98 98 97* 96* 99  CO2 29 29 28 26 27   GLUCOSE 147* 179* 115* 135* 159*  BUN 7 10 6  5* 7  CREATININE 0.50* 0.57* 0.51* 0.50* 0.55*  CALCIUM 7.5* 7.5* 7.3* 7.6* 7.6*  MG 1.6*  --  1.6*  --   --   PHOS 3.2  --  3.1  --   --     F/u labs ordered Unresulted Labs (From admission, onward)          Start     Ordered   02/02/21 0500  Body fluid culture w Gram Stain  Tomorrow morning,   R       Question:  Are there also cytology or pathology orders on this specimen?  Answer:  No   02/01/21 2004          Signed04/22/22, MD [facility] 02/07/2021

## 2021-02-07 NOTE — Progress Notes (Signed)
PHARMACY CONSULT NOTE FOR:  OUTPATIENT  PARENTERAL ANTIBIOTIC THERAPY (OPAT)  Indication: Hepatic Abscess Regimen: Ceftriaxone 2 gm IV Q 24 hours + Metronidazole 500 mg PO BID End date: 03/16/21  IV antibiotic discharge orders are pended. To discharging provider:  please sign these orders via discharge navigator,  Select New Orders & click on the button choice - Manage This Unsigned Work.     Thank you for allowing pharmacy to be a part of this patient's care.  Sharin Mons, PharmD, BCPS, BCIDP Infectious Diseases Clinical Pharmacist Phone: [phone] 02/07/2021, 4:36 PM

## 2021-02-08 ENCOUNTER — Inpatient Hospital Stay: Payer: Self-pay

## 2021-02-08 ENCOUNTER — Other Ambulatory Visit (HOSPITAL_COMMUNITY): Payer: Self-pay

## 2021-02-08 DIAGNOSIS — K750 Abscess of liver: Secondary | ICD-10-CM | POA: Diagnosis not present

## 2021-02-08 LAB — BASIC METABOLIC PANEL
Anion gap: 8 (ref 5–15)
BUN: 5 mg/dL — ABNORMAL LOW (ref 6–20)
CO2: 28 mmol/L (ref 22–32)
Calcium: 7.9 mg/dL — ABNORMAL LOW (ref 8.9–10.3)
Chloride: 97 mmol/L — ABNORMAL LOW (ref 98–111)
Creatinine, Ser: 0.56 mg/dL — ABNORMAL LOW (ref 0.61–1.24)
GFR, Estimated: >60 mL/min (ref >60)
Glucose, Bld: 145 mg/dL — ABNORMAL HIGH (ref 70–99)
Potassium: 4 mmol/L (ref 3.5–5.1)
Sodium: 133 mmol/L — ABNORMAL LOW (ref 135–145)

## 2021-02-08 LAB — CBC WITH DIFFERENTIAL/PLATELET
Abs Immature Granulocytes: 0.04 10*3/uL (ref 0.00–0.07)
Basophils Absolute: 0.1 10*3/uL (ref 0.0–0.1)
Basophils Relative: 1 %
Eosinophils Absolute: 0 10*3/uL (ref 0.0–0.5)
Eosinophils Relative: 1 %
HCT: 28.3 % — ABNORMAL LOW (ref 39.0–52.0)
Hemoglobin: 9 g/dL — ABNORMAL LOW (ref 13.0–17.0)
Immature Granulocytes: 1 %
Lymphocytes Relative: 24 %
Lymphs Abs: 1.9 10*3/uL (ref 0.7–4.0)
MCH: 27 pg (ref 26.0–34.0)
MCHC: 31.8 g/dL (ref 30.0–36.0)
MCV: 85 fL (ref 80.0–100.0)
Monocytes Absolute: 0.4 10*3/uL (ref 0.1–1.0)
Monocytes Relative: 5 %
Neutro Abs: 5.3 10*3/uL (ref 1.7–7.7)
Neutrophils Relative %: 68 %
Platelets: 499 10*3/uL — ABNORMAL HIGH (ref 150–400)
RBC: 3.33 MIL/uL — ABNORMAL LOW (ref 4.22–5.81)
RDW: 14.5 % (ref 11.5–15.5)
WBC: 7.7 10*3/uL (ref 4.0–10.5)
nRBC: 0 % (ref 0.0–0.2)

## 2021-02-08 LAB — GLUCOSE, CAPILLARY
Glucose-Capillary: 131 mg/dL — ABNORMAL HIGH (ref 70–99)
Glucose-Capillary: 135 mg/dL — ABNORMAL HIGH (ref 70–99)
Glucose-Capillary: 115 mg/dL — ABNORMAL HIGH (ref 70–99)
Glucose-Capillary: 135 mg/dL — ABNORMAL HIGH (ref 70–99)

## 2021-02-08 MED ORDER — SODIUM CHLORIDE 0.9 % IJ SOLN: INTRAMUSCULAR | 3 refills | Status: DC

## 2021-02-08 MED ORDER — GLIPIZIDE ER 2.5 MG PO TB24: 2.5000 mg | ORAL_TABLET | Freq: Every day | ORAL | 2 refills | Status: DC

## 2021-02-08 MED ORDER — METRONIDAZOLE 500 MG PO TABS: 500.0000 mg | ORAL_TABLET | Freq: Two times a day (BID) | ORAL | 0 refills | Status: DC

## 2021-02-08 MED ORDER — HEPARIN SOD (PORK) LOCK FLUSH 100 UNIT/ML IV SOLN
250.0000 [IU] | INTRAVENOUS | Status: AC | PRN
  Administered 2021-02-08: 250 [IU]
  Filled 2021-02-08: qty 2.5

## 2021-02-08 MED ORDER — METFORMIN HCL 1000 MG PO TABS: 1.0000 | ORAL_TABLET | Freq: Two times a day (BID) | ORAL | 2 refills | Status: DC

## 2021-02-08 MED ORDER — SODIUM CHLORIDE 0.9 % IJ SOLN
INTRAMUSCULAR | 3 refills | Status: DC
  Filled 2021-02-08: qty 15, fill #0

## 2021-02-08 MED ORDER — METRONIDAZOLE 500 MG PO TABS: 500.0000 mg | ORAL_TABLET | Freq: Two times a day (BID) | ORAL | 0 refills | Status: AC

## 2021-02-08 MED ORDER — OXYCODONE HCL 5 MG PO TABS: 5.0000 mg | ORAL_TABLET | ORAL | 0 refills | Status: DC | PRN

## 2021-02-08 MED ORDER — CEFTRIAXONE IV (FOR PTA / DISCHARGE USE ONLY): 2.0000 g | INTRAVENOUS | 0 refills | Status: AC

## 2021-02-08 MED ORDER — NORMAL SALINE FLUSH 0.9 % IV SOLN
5.0000 mL | Freq: Every day | INTRAVENOUS | 0 refills | Status: DC
  Filled 2021-02-08 (×2): qty 300, 20d supply, fill #0
  Filled 2021-02-09: qty 300, 60d supply, fill #0

## 2021-02-08 MED ORDER — SODIUM CHLORIDE 0.9 % IJ SOLN
INTRAMUSCULAR | 3 refills | Status: DC
  Filled 2021-02-08: qty 5, fill #0

## 2021-02-08 NOTE — TOC Progression Note (Signed)
Transition of Care [facility]) - Progression Note    Patient Details  Name: Maurice Hodges MRN: [id number] Date of Birth: Jul 09, 1961  Transition of Care [facility]) CM/SW Contact  Nadene Rubins Adria Devon, RN Phone Number: 02/08/2021, 12:19 PM  Clinical Narrative:     Elita Quick with [facility] aware discharge today. Pam will call patient's daughter to schedule a time for education today prior to discharge.   Expected Discharge Plan: Home w Home Health Services    Expected Discharge Plan and Services Expected Discharge Plan: Home w Home Health Services     Post Acute Care Choice: Home Health Living arrangements for the past 2 months: Single Family Home Expected Discharge Date: 02/08/21                 DME Agency: NA                   Social Determinants of Health (SDOH) Interventions    Readmission Risk Interventions No flowsheet data found.

## 2021-02-08 NOTE — Progress Notes (Signed)
Peripherally Inserted Central Catheter Placement  The IV Nurse has discussed with the patient and/or persons authorized to consent for the patient, the purpose of this procedure and the potential benefits and risks involved with this procedure.  The benefits include less needle sticks, lab draws from the catheter, and the patient may be discharged home with the catheter. Risks include, but not limited to, infection, bleeding, blood clot (thrombus formation), and puncture of an artery; nerve damage and irregular heartbeat and possibility to perform a PICC exchange if needed/ordered by physician.  Alternatives to this procedure were also discussed.  Bard Power PICC patient education guide, fact sheet on infection prevention and patient information card has been provided to patient /or left at bedside.    PICC Placement Documentation  PICC Single Lumen 02/08/21 Left Basilic 39 cm 0 cm (Active)  Indication for Insertion or Continuance of Line Prolonged intravenous therapies 02/08/21 1715  Exposed Catheter (cm) 0 cm 02/08/21 1715  Site Assessment Clean;Dry;Intact 02/08/21 1715  Line Status Flushed;Blood return noted;Saline locked 02/08/21 1715  Dressing Type Transparent 02/08/21 1715  Dressing Status Clean;Dry;Intact 02/08/21 1715  Antimicrobial disc in place? Yes 02/08/21 1715  Dressing Change Due 02/15/21 02/08/21 1715       Audrie Gallus 02/08/2021, 5:34 PM

## 2021-02-08 NOTE — Discharge Summary (Addendum)
Physician Discharge Summary  Maurice Hodges MMN:[id number] DOB: 1961/05/13 DOA: 02/01/2021  PCP: [facility]  Admit date: 02/01/2021 Discharge date: 02/08/2021  Admitted From: Home Discharge disposition: Home with IV antibiotics   Code Status: Full Code  Diet Recommendation: Diabetic diet  Discharge Diagnosis:   Principal Problem:   Bacterial liver abscess Active Problems:   Uncontrolled type II diabetes mellitus ([place])   Hypokalemia   AKI (acute kidney injury) ([place])   Severe sepsis ([place])   Normocytic anemia   Liver abscess due to bacteria   Abdominal fluid collection   Central venous catheter in place   Intra-abdominal abscess [facility])  Chief Complaint: Abdominal pain, chills   Brief narrative: Maurice Hodges is a 60 y.o. male with PMH significant for DM2 on metformin.   4/13, patient presented to urgent care for some chills and abdominal discomfort, he was noted to have blood sugar level greater than 600 and directed to ED at [facility] In the ED, patient was found to be afebrile, hemodynamically stable. CT abdomen showed large hepatic abscess for which he had a percutaneous drain placed by IR on 4/15. Cultures from the abscess grew E. coli and Bacteroides fragilis.  Blood culture grew Bacteroides.  He was started on IV Zosyn.   Apparently there was an issue with the locking system of his abscess drain tube because of which it could not drain and he becomes systemically ill with fever and sweats.  Once they were able to reopen his drain, it immediately drained a lot of purulent material.  In the interim he was switched to IV meropenem.   Repeat CT scan showed additional intra-abdominal abscesses and hence he was transferred to [facility] on 4/20.  Subjective: Patient was seen and examined this morning.   Lying on bed.  Not in distress. No fever, diarrhea controlled.   Continues to have purulent drainage from all 3 abdominal drains.    Clinically stable and feels ready to go home.  Assessment/Plan: Intra-abdominal abscess with severe sepsis Polymicrobial hepatic abscess-E. coli and bacteroid fragilis Bacteremia with bacteroids fragilis -Initially hospitalized at [facility] (4/13-4/20) with sepsis.  -Transferred to [facility] after abscess did not get better with percutaneous drainage and IV antibiotics. Sepsis has resolved by the time of transfer. -4/15 had first abscess drain placed while at [facility] -4/21, had 2 more drain placed by IR here at [facility].   -4/26, patient underwent repeat abdominal CT which was reviewed with IR.  No need of further drain placement.   -Patient had blood cultures done at outside hospital which revealed bacteroids fragilis.  He was placed on IV meropenem.  Repeated blood cultures here did not show any growth.  Per ID recommendation, patient was switched to IV Rocephin and oral Flagyl.  -ID recommends total of 6 weeks course of IV Rocephin 2 g daily and oral Flagyl 500 mg twice daily.  Patient will follow up with ID and IR as an outpatient. -He would benefit from colonoscopy as an outpatient.  Referral to GI given.  Type 2 diabetes mellitus -A1c 14 on 4/13 -Was only on metformin 1000 mg twice daily at home. -Currently blood sugar is controlled mostly under 150 on metformin 1000 mg twice daily and glipizide 2.5 mg daily.  Despite high A1c, he is not requiring insulin treatment. -We will discharge him on metformin and glipizide. Recent Labs  Lab 02/07/21 2352 02/08/21 0356 02/08/21 0803 02/08/21 1204 02/08/21 1612  GLUCAP 106* 131* 135* 115*  135*   Acute anemia  -No baseline hemoglobin level available.  Hemoglobin was reportedly at 10.8 on 4/13, lower at 9.6 on 4/21.  Currently stable between 8 and 9 for last 3 days. Anemia panel normal as below Recent Labs    02/03/21 0515 02/04/21 0330 02/05/21 0343 02/06/21 0328 02/07/21 0343 02/08/21 0411  HGB 9.5* 9.5* 8.7* 8.9* 8.7* 9.0*   MCV 84.8 86.4 86.3 86.2 84.9 85.0  VITAMINB12 1,072*  --   --   --   --   --   FOLATE 18.2  --   --   --   --   --   FERRITIN 1,113*  --   --   --   --   --   TIBC 136*  --   --   --   --   --   IRON 18*  --   --   --   --   --   RETICCTPCT 3.1  --   --   --   --   --    Hypokalemia/hypomagnesemia  -Potassium and magnesium level checked and replaced. Recent Labs  Lab 02/03/21 0515 02/04/21 0330 02/05/21 0343 02/06/21 0328 02/07/21 0343 02/08/21 0411  K 3.9 4.2 3.9 3.8 3.8 4.0  MG 1.6*  --  1.6*  --   --   --   PHOS 3.2  --  3.1  --   --   --    Hypoalbuminemia Generalized anasarca upper CT scan -Albumin level severely low at 1.2. Nutrition consult appreciated. -Expect improvement in albumin level and anasarca with improvement in diet at home.   Wound care:    Discharge Exam:   Vitals:   02/07/21 0402 02/07/21 1307 02/07/21 1951 02/08/21 0359  BP: 111/73 117/82 119/77 124/81  Pulse: 93 (!) 101 94 96  Resp: '17  18 15  ' Temp: 98.6 F (37 C) 98.2 F (36.8 C) 98.7 F (37.1 C) 98.4 F (36.9 C)  TempSrc: Oral Oral Oral   SpO2: 95% 98% 95% 95%  Weight:      Height:        Body mass index is 25.05 kg/m.  General exam: Pleasant middle-aged Hispanic male.  Not in distress Skin: No rashes, lesions or ulcers. HEENT: Atraumatic, normocephalic, no obvious bleeding Lungs: Clear to auscultation bilaterally CVS: Regular rate and rhythm, no murmur GI/Abd soft, nontender, nondistended, bowel sound present, has 3 JP drains with purulent drainage CNS: Alert, awake, oriented x3 Psychiatry: Mood appropriate Extremities: No pedal edema, no calf tenderness  Follow ups:   Discharge Instructions    [facility] pharmacist to adjust dose for Vancomycin, Aminoglycosides and other anti-infective therapies as requested by physician.   Complete by: As directed    [facility] to provide Cath Flo 60m   Complete by: As directed    Administer for PICC line  occlusion and as ordered by physician for other access device issues.   Ambulatory referral to Gastroenterology   Complete by: As directed    What is the reason for referral?: Colonoscopy   Anaphylaxis Kit: Provided to treat any anaphylactic reaction to the medication being provided to the patient if First Dose or when requested by physician   Complete by: As directed    Epinephrine 171mml vial / amp: Administer 0.47m64m0.47ml46mubcutaneously once for moderate to severe anaphylaxis, nurse to call physician and pharmacy when reaction occurs and call 911 if needed for immediate care   Diphenhydramine 50mg9mIV vial:  Administer 25-38m IV/IM PRN for first dose reaction, rash, itching, mild reaction, nurse to call physician and pharmacy when reaction occurs   Sodium Chloride 0.9% NS 5024mIV: Administer if needed for hypovolemic blood pressure drop or as ordered by physician after call to physician with anaphylactic reaction   Change dressing on IV access line weekly and PRN   Complete by: As directed    Diet Carb Modified   Complete by: As directed    Flush IV access with Sodium Chloride 0.9% and Heparin 10 units/ml or 100 units/ml   Complete by: As directed    Home infusion instructions - [facility]   Complete by: As directed    Instructions: Flush IV access with Sodium Chloride 0.9% and Heparin 10units/ml or 100units/ml   Change dressing on IV access line: Weekly and PRN   Instructions Cath Flo 12m3mAdminister for PICC Line occlusion and as ordered by physician for other access device   [facility] pharmacist to adjust dose for: Vancomycin, Aminoglycosides and other anti-infective therapies as requested by physician   Increase activity slowly   Complete by: As directed    Method of administration may be changed at the discretion of home infusion pharmacist based upon assessment of the patient and/or caregiver's ability to self-administer the medication ordered   Complete  by: As directed    No dressing needed   Complete by: As directed       Follow-up Information    [facility].   Specialty: Family Medicine Contact information: [phone]     Vu,Jabier MuttonD Follow up.   Specialty: Infectious Diseases Contact information: [phone]        KarRonnette JuniperD Follow up.   Specialty: Gastroenterology Contact information: [address] [phone]        HenMarkus DaftD Follow up.   Specialties: Interventional Radiology, Radiology Why: IR scheduler will call you with appointment date/time (typically 10-14 days after discharge from hospital). Please call [phone] any questions or concerns before your appointment. Contact information: [address] [place] [phone]            Recommendations for Outpatient Follow-Up:   1. Follow-up with PCP as an outpatient 2. Follow-up with IR, ID as an outpatient 3. Referral to GI given  Discharge Instructions:  Follow with Primary MD [facility], [facility] in 7 days   Get CBC/BMP checked in next visit within 1 week by PCP or SNF MD ( we routinely change or add medications that can affect your baseline labs and fluid status, therefore we recommend that you get the mentioned basic workup next visit with your PCP, your PCP may decide not to get them or add new tests based on their clinical decision)  On your next visit with your PCP, please Get Medicines reviewed and adjusted.  Please request your PCP  to go over all Hospital Tests and Procedure/Radiological results at the follow up, please get all Hospital records sent to your Prim MD by signing hospital release before you go home.  Activity: As tolerated with Full fall precautions use walker/cane & assistance as needed  For  Heart failure patients - Check your Weight same time everyday, if you gain over 2 pounds, or you develop in  leg swelling, experience more shortness of breath or chest pain, call your Primary MD immediately. Follow Cardiac Low Salt Diet and 1.5 lit/day fluid restriction.  If you have smoked or chewed Tobacco in the last 2 yrs please stop smoking, stop any regular Alcohol  and or any Recreational drug use.  If you experience worsening of your admission symptoms, develop shortness of breath, life threatening emergency, suicidal or homicidal thoughts you must seek medical attention immediately by calling 911 or calling your MD immediately  if symptoms less severe.  You Must read complete instructions/literature along with all the possible adverse reactions/side effects for all the Medicines you take and that have been prescribed to you. Take any new Medicines after you have completely understood and accpet all the possible adverse reactions/side effects.   Do not drive, operate heavy machinery, perform activities at heights, swimming or participation in water activities or provide baby sitting services if your were admitted for syncope or siezures until you have seen by Primary MD or a Neurologist and advised to do so again.  Do not drive when taking Pain medications.  Do not take more than prescribed Pain, Sleep and Anxiety Medications  Wear Seat belts while driving.   Please note You were cared for by a hospitalist during your hospital stay. If you have any questions about your discharge medications or the care you received while you were in the hospital after you are discharged, you can call the unit and asked to speak with the hospitalist on call if the hospitalist that took care of you is not available. Once you are discharged, your primary care physician will handle any further medical issues. Please note that NO REFILLS for any discharge medications will be authorized once you are discharged, as  it is imperative that you return to your primary care physician (or establish a relationship with a primary care physician if you do not have one) for your aftercare needs so that they can reassess your need for medications and monitor your lab values.    Allergies as of 02/08/2021   No Known Allergies     Medication List    TAKE these medications   atorvastatin 40 MG tablet Commonly known as: LIPITOR Take 40 mg by mouth daily.   cefTRIAXone  IVPB Commonly known as: ROCEPHIN Inject 2 g into the vein daily. Indication:  Hepatic Abscess First Dose: Yes Last Day of Therapy:  03/16/21 Labs - Once weekly:  CBC/D and BMP, Labs - Every other week:  ESR and CRP Method of administration: IV Push Method of administration may be changed at the discretion of home infusion pharmacist based upon assessment of the patient and/or caregiver's ability to self-administer the medication ordered.   gabapentin 300 MG capsule Commonly known as: NEURONTIN Take 1 capsule by mouth 3 (three) times daily.   glipiZIDE 2.5 MG 24 hr tablet Commonly known as: GLUCOTROL XL Take 1 tablet (2.5 mg total) by mouth daily with breakfast. Start taking on: February 09, 2021   metFORMIN 1000 MG tablet Commonly known as: GLUCOPHAGE Take 1 tablet (1,000 mg total) by mouth 2 (two) times daily.   metroNIDAZOLE 500 MG tablet Commonly known as: FLAGYL Take 1 tablet (500 mg total) by mouth every 12 (twelve) hours.   Normal Saline Flush 0.9 % Soln Use 5 mLs in each drain daily. (3 drains total =62m)   oxyCODONE 5 MG immediate release tablet Commonly known as: Oxy IR/ROXICODONE Take 1 tablet (5 mg total) by mouth every  4 (four) hours as needed for moderate pain.   sodium chloride 0.9 % injection Flush each drain with 5 mL QD.            Discharge Care Instructions  (From admission, onward)         Start     Ordered   02/08/21 0000  Change dressing on IV access line weekly and PRN  (Home infusion instructions  - [facility] )        02/08/21 1030   02/08/21 0000  No dressing needed        02/08/21 1030          Time coordinating discharge: 35 minutes  The results of significant diagnostics from this hospitalization (including imaging, microbiology, ancillary and laboratory) are listed below for reference.    Procedures and Diagnostic Studies:   DG CHEST PORT 1 VIEW  Result Date: 02/01/2021 CLINICAL DATA:  Check PICC line placement EXAM: PORTABLE CHEST 1 VIEW COMPARISON:  CT from earlier in the same day. FINDINGS: Cardiac shadow is enlarged in size. PICC line is noted with the tip at the cavoatrial junction in satisfactory position. Bibasilar atelectatic changes are noted with pleural effusions right considerably greater than left similar to that seen on recent CT examination. No acute bony abnormality is noted. Drainage catheter is noted in the right upper quadrant consistent with the known hepatic abscess catheter. IMPRESSION: PICC line in satisfactory position. Bibasilar atelectasis right greater than left with associated effusions right greater than left. Right upper quadrant drainage catheter stable in appearance. Electronically Signed   By: Inez Catalina M.D.   On: 02/01/2021 21:11   CT IMAGE GUIDED DRAINAGE BY PERCUTANEOUS CATHETER  Result Date: 02/02/2021 INDICATION: 60 year old with a hepatic abscess and status post percutaneous drainage. Subsequently, the patient has developed intra-abdominal fluid collections that are concerning for abscesses. Patient presents for placement of additional CT-guided drains. EXAM: CT-GUIDED PLACEMENT OF A DRAIN IN THE RIGHT UPPER ABDOMINAL FLUID COLLECTION CT-GUIDED PLACEMENT OF A DRAIN IN THE RIGHT LOWER ABDOMINAL FLUID COLLECTION MEDICATIONS: Moderate sedation ANESTHESIA/SEDATION: Fentanyl 200 mcg IV; Versed 4.0 mg IV Moderate Sedation Time:  45 minutes The patient was continuously monitored during the procedure by the interventional radiology nurse  under my direct supervision. COMPLICATIONS: None immediate. PROCEDURE: Informed written consent was obtained from the patient after a thorough discussion of the procedural risks, benefits and alternatives. All questions were addressed. Maximal Sterile Barrier Technique was utilized including caps, mask, sterile gowns, sterile gloves, sterile drape, hand hygiene and skin antiseptic. A timeout was performed prior to the initiation of the procedure. Patient was placed supine on the CT scanner. Images through the abdomen and pelvis were obtained. The fluid collection in the right upper abdomen just inferior to the right hepatic lobe was identified and targeted. The large fluid collection in the right lower abdomen was identified and targeted. Right side of the abdomen was prepped with chlorhexidine and sterile field was created. The right upper abdominal fluid collection was targeted first. The skin was anesthetized with 1% lidocaine. Using CT guidance, an 18 gauge trocar needle was directed into the fluid collection just below the right hepatic lobe. Cloudy yellow fluid was aspirated. Superstiff Amplatz wire was advanced into the collection. Needle was removed over the wire. The tract was dilated to accommodate a 10 [place] multipurpose drain. Catheter was attached to a suction bulb and approximately 50 mL of cloudy yellow fluid was removed. Attention was directed to the right lower abdominal fluid  collection. Skin was anesthetized with 1% lidocaine. A small incision was made. Using CT guidance, an 18 gauge trocar needle was directed into the right lower abdominal fluid collection. Yellow fluid was aspirated. Superstiff Amplatz wire was advanced into the collection and the tract was dilated to accommodate a 10 [place] multipurpose drain. Additional CT images were obtained. CT demonstrated that the right lower abdominal drain was crossing the midline and would likely decompress the pelvic fluid collection as well.  Approximately 325 mL of cloudy yellow fluid was removed from the right lower quadrant drain using an evacuation bag. Drain was attached to a suction bulb at the end of the procedure. Both drains was secured to the skin with suture and StatLock. Dressings were placed. FINDINGS: Complex or loculated fluid collection along the inferior right hepatic lobe. Drain was placed within this collection along the inferior right hepatic lobe and 50 mL of cloudy yellow fluid was removed. Again noted is an intrahepatic drainage catheter within the abscess. Residual areas of low-density and gas within the hepatic abscess. Large fluid collection in the lower abdomen and pelvis. Right lower quadrant drain was placed and crossed the midline. Fortunately, this drain was able to decompress the left side of the lower abdominal fluid collection and the pelvic fluid collection. 325 mL of cloudy yellow fluid was removed from the lower abdominal drain. IMPRESSION: 1. CT-guided placement of a drainage catheter in the right upper abdominal fluid collection just inferior to the right hepatic lobe. 2. CT-guided placement of a drainage catheter in the right lower abdominal fluid collection. Fortunately, this drain cross the midline and was also decompressing the left lower abdominal fluid collection and the pelvic fluid collection. 3. Hepatic drain is still intact with residual fluid and gas in the complex hepatic abscess. Electronically Signed   By: Markus Daft M.D.   On: 02/02/2021 16:21   CT IMAGE GUIDED DRAINAGE BY PERCUTANEOUS CATHETER  Result Date: 02/02/2021 INDICATION: 60 year old with a hepatic abscess and status post percutaneous drainage. Subsequently, the patient has developed intra-abdominal fluid collections that are concerning for abscesses. Patient presents for placement of additional CT-guided drains. EXAM: CT-GUIDED PLACEMENT OF A DRAIN IN THE RIGHT UPPER ABDOMINAL FLUID COLLECTION CT-GUIDED PLACEMENT OF A DRAIN IN THE RIGHT  LOWER ABDOMINAL FLUID COLLECTION MEDICATIONS: Moderate sedation ANESTHESIA/SEDATION: Fentanyl 200 mcg IV; Versed 4.0 mg IV Moderate Sedation Time:  45 minutes The patient was continuously monitored during the procedure by the interventional radiology nurse under my direct supervision. COMPLICATIONS: None immediate. PROCEDURE: Informed written consent was obtained from the patient after a thorough discussion of the procedural risks, benefits and alternatives. All questions were addressed. Maximal Sterile Barrier Technique was utilized including caps, mask, sterile gowns, sterile gloves, sterile drape, hand hygiene and skin antiseptic. A timeout was performed prior to the initiation of the procedure. Patient was placed supine on the CT scanner. Images through the abdomen and pelvis were obtained. The fluid collection in the right upper abdomen just inferior to the right hepatic lobe was identified and targeted. The large fluid collection in the right lower abdomen was identified and targeted. Right side of the abdomen was prepped with chlorhexidine and sterile field was created. The right upper abdominal fluid collection was targeted first. The skin was anesthetized with 1% lidocaine. Using CT guidance, an 18 gauge trocar needle was directed into the fluid collection just below the right hepatic lobe. Cloudy yellow fluid was aspirated. Superstiff Amplatz wire was advanced into the collection. Needle was removed over the  wire. The tract was dilated to accommodate a 10 [place] multipurpose drain. Catheter was attached to a suction bulb and approximately 50 mL of cloudy yellow fluid was removed. Attention was directed to the right lower abdominal fluid collection. Skin was anesthetized with 1% lidocaine. A small incision was made. Using CT guidance, an 18 gauge trocar needle was directed into the right lower abdominal fluid collection. Yellow fluid was aspirated. Superstiff Amplatz wire was advanced into the collection  and the tract was dilated to accommodate a 10 [place] multipurpose drain. Additional CT images were obtained. CT demonstrated that the right lower abdominal drain was crossing the midline and would likely decompress the pelvic fluid collection as well. Approximately 325 mL of cloudy yellow fluid was removed from the right lower quadrant drain using an evacuation bag. Drain was attached to a suction bulb at the end of the procedure. Both drains was secured to the skin with suture and StatLock. Dressings were placed. FINDINGS: Complex or loculated fluid collection along the inferior right hepatic lobe. Drain was placed within this collection along the inferior right hepatic lobe and 50 mL of cloudy yellow fluid was removed. Again noted is an intrahepatic drainage catheter within the abscess. Residual areas of low-density and gas within the hepatic abscess. Large fluid collection in the lower abdomen and pelvis. Right lower quadrant drain was placed and crossed the midline. Fortunately, this drain was able to decompress the left side of the lower abdominal fluid collection and the pelvic fluid collection. 325 mL of cloudy yellow fluid was removed from the lower abdominal drain. IMPRESSION: 1. CT-guided placement of a drainage catheter in the right upper abdominal fluid collection just inferior to the right hepatic lobe. 2. CT-guided placement of a drainage catheter in the right lower abdominal fluid collection. Fortunately, this drain cross the midline and was also decompressing the left lower abdominal fluid collection and the pelvic fluid collection. 3. Hepatic drain is still intact with residual fluid and gas in the complex hepatic abscess. Electronically Signed   By: Markus Daft M.D.   On: 02/02/2021 16:21     Labs:   Basic Metabolic Panel: Recent Labs  Lab 02/03/21 0515 02/04/21 0330 02/05/21 0343 02/06/21 0328 02/07/21 0343 02/08/21 0411  NA 133* 132* 131* 130* 131* 133*  K 3.9 4.2 3.9 3.8 3.8 4.0   CL 98 98 97* 96* 99 97*  CO2 '29 29 28 26 27 28  ' GLUCOSE 147* 179* 115* 135* 159* 145*  BUN '7 10 6 ' 5* 7 5*  CREATININE 0.50* 0.57* 0.51* 0.50* 0.55* 0.56*  CALCIUM 7.5* 7.5* 7.3* 7.6* 7.6* 7.9*  MG 1.6*  --  1.6*  --   --   --   PHOS 3.2  --  3.1  --   --   --    GFR Estimated Creatinine Clearance: 90.8 mL/min (A) (by C-G formula based on SCr of 0.56 mg/dL (L)). Liver Function Tests: Recent Labs  Lab 02/02/21 0417 02/03/21 0515 02/04/21 0330  AST 16 14* 19  ALT '23 17 18  ' ALKPHOS 94 76 81  BILITOT 0.6 0.5 0.4  PROT 5.1* 5.1* 5.3*  ALBUMIN 1.2* 1.2* 1.2*   No results for input(s): LIPASE, AMYLASE in the last 168 hours. No results for input(s): AMMONIA in the last 168 hours. Coagulation profile No results for input(s): INR, PROTIME in the last 168 hours.  CBC: Recent Labs  Lab 02/04/21 0330 02/05/21 0343 02/06/21 0328 02/07/21 0343 02/08/21 0411  WBC 11.2* 8.8 8.4  7.9 7.7  NEUTROABS 8.7* 6.7 6.4 5.5 5.3  HGB 9.5* 8.7* 8.9* 8.7* 9.0*  HCT 30.5* 27.6* 28.1* 27.5* 28.3*  MCV 86.4 86.3 86.2 84.9 85.0  PLT 484* 462* 489* 502* 499*   Cardiac Enzymes: No results for input(s): CKTOTAL, CKMB, CKMBINDEX, TROPONINI in the last 168 hours. BNP: Invalid input(s): POCBNP CBG: Recent Labs  Lab 02/07/21 2352 02/08/21 0356 02/08/21 0803 02/08/21 1204 02/08/21 1612  GLUCAP 106* 131* 135* 115* 135*   D-Dimer No results for input(s): DDIMER in the last 72 hours. Hgb A1c No results for input(s): HGBA1C in the last 72 hours. Lipid Profile No results for input(s): CHOL, HDL, LDLCALC, TRIG, CHOLHDL, LDLDIRECT in the last 72 hours. Thyroid function studies No results for input(s): TSH, T4TOTAL, T3FREE, THYROIDAB in the last 72 hours.  Invalid input(s): FREET3 Anemia work up No results for input(s): VITAMINB12, FOLATE, FERRITIN, TIBC, IRON, RETICCTPCT in the last 72 hours. Microbiology Recent Results (from the past 240 hour(s))  Aerobic/Anaerobic Culture (surgical/deep  wound)     Status: None   Collection Time: 02/02/21 12:43 PM   Specimen: Abscess; Abdominal Fluid  Result Value Ref Range Status   Specimen Description ABSCESS LIVER  Final   Special Requests DRAINAGE  Final   Gram Stain   Final    ABUNDANT WBC PRESENT,BOTH PMN AND MONONUCLEAR NO ORGANISMS SEEN    Culture   Final    No growth aerobically or anaerobically. Performed at [facility], [place] [address], [place],  [address]    Report Status 02/07/2021 FINAL  Final     Signed: Marlowe Aschoff Jex Strausbaugh  [facility] 02/08/2021, 5:29 PM

## 2021-02-08 NOTE — Progress Notes (Signed)
Referring Physician(s): Opyd, Christia [facility])  Supervising Physician: Daryll Brod  Patient Status:  [facility] - In-pt  Chief Complaint: Follow up hepatic abscess drains x 2 and intra-abdominal abscess drain x 1  Subjective:  Patient denies complaints today, Maurice Hodges is excited to go home. We discussed drain care and outpatient follow up which Maurice Hodges states understanding to. His son was present during this discussion as well.  Allergies: Patient has no known allergies.  Medications: Prior to Admission medications   Medication Sig Start Date End Date Taking? Authorizing Provider  atorvastatin (LIPITOR) 40 MG tablet Take 40 mg by mouth daily. 11/21/20  Yes [provider]  cefTRIAXone (ROCEPHIN) IVPB Inject 2 g into the vein daily. Indication:  Hepatic Abscess First Dose: Yes Last Day of Therapy:  03/16/21 Labs - Once weekly:  CBC/D and BMP, Labs - Every other week:  ESR and CRP Method of administration: IV Push Method of administration may be changed at the discretion of home infusion pharmacist based upon assessment of the patient and/or caregiver's ability to self-administer the medication ordered. 02/08/21 03/17/21 Yes Dahal, Marlowe Aschoff, MD  gabapentin (NEURONTIN) 300 MG capsule Take 1 capsule by mouth 3 (three) times daily. 11/22/20  Yes [provider]  sodium chloride 0.9 % injection Flush each drain with 5 mL QD. 02/08/21  Yes Candiss Norse A, PA-C  glipiZIDE (GLUCOTROL XL) 2.5 MG 24 hr tablet Take 1 tablet (2.5 mg total) by mouth daily with breakfast. 02/09/21 05/10/21  Dahal, Marlowe Aschoff, MD  metFORMIN (GLUCOPHAGE) 1000 MG tablet Take 1 tablet (1,000 mg total) by mouth 2 (two) times daily. 02/08/21 05/09/21  Terrilee Croak, MD  metroNIDAZOLE (FLAGYL) 500 MG tablet Take 1 tablet (500 mg total) by mouth every 12 (twelve) hours. 02/08/21 03/17/21  Terrilee Croak, MD  oxyCODONE (OXY IR/ROXICODONE) 5 MG immediate release tablet Take 1 tablet (5 mg total) by mouth every 4 (four) hours as needed for  moderate pain. 02/08/21   Terrilee Croak, MD     Vital Signs: BP 124/81 (BP Location: Left Arm)   Pulse 96   Temp 98.4 F (36.9 C)   Resp 15   Ht '5\' 6"'  (1.676 m) Comment: from 01/25/21 encounter  Wt 155 lb 3.3 oz (70.4 kg) Comment: bedscale  SpO2 95%   BMI 25.05 kg/m   Physical Exam Vitals reviewed.  Constitutional:      General: Maurice Hodges is not in acute distress. HENT:     Head: Normocephalic.  Cardiovascular:     Rate and Rhythm: Normal rate.  Pulmonary:     Effort: Pulmonary effort is normal.  Abdominal:     Palpations: Abdomen is soft.     Comments: Drain # 1 - minimal OP in bulb, after flushing and aspirating drain there was return of approximately 20 cc thick purulent output. Drain tubing was changed to suction bulb w/o stop cock.  Drain #2 - minimal OP in bulb (just emptied), flushes/aspitates appropriately with return of slightly cloudy serous output.  Drain #3 - minimal OP in bulb (just emptied), flushes/aspirates easily with return of bloody purulent output.  Skin:    General: Skin is warm and dry.  Neurological:     Mental Status: Maurice Hodges is alert. Mental status is at baseline.     Imaging: CT ABDOMEN PELVIS W CONTRAST  Result Date: 02/07/2021 CLINICAL DATA:  Intra-abdominal abscess, hepatic abscess, follow-up examination EXAM: CT ABDOMEN AND PELVIS WITH CONTRAST TECHNIQUE: Multidetector CT imaging of the abdomen and pelvis was performed using the standard  protocol following bolus administration of intravenous contrast. CONTRAST:  129m OMNIPAQUE IOHEXOL 300 MG/ML  SOLN COMPARISON:  02/01/2021, 01/25/2021 FINDINGS: Lower chest: Moderate right and small left pleural effusions are again visualized with compressive atelectasis of the lower lobes bilaterally, right greater than left. This appears stable since prior examination. Central venous catheter tip noted within the right atrium. Moderate multi-vessel coronary artery calcification. Global cardiac size within normal limits.  Hepatobiliary: Percutaneous drainage catheter is again identified within segment 8. The adjacent hepatic abscess has recurred, measuring at least 5.6 x 6.8 x 7.7 cm. While this appears in continuity with the percutaneous drainage catheter, its interval increase in size suggests either loculation and exclusion from the drainage catheter or malfunction of the drainage catheter. Multiple micro abscesses are again identified within the right hepatic dome. No intra or extrahepatic biliary ductal dilation. Mild pericholecystic fluid is again identified, likely related to the adjacent inflammatory process. Perihepatic abscess subjacent to the right hepatic lobe has decreased in size and percutaneous drainage catheter seen within the inferior aspect of the collection. The residual collection measures roughly 2.1 x 5.4 x 4.0 cm (axial image # 47/coronal image # 71). There is infiltration of the adjacent peritoneal fat in keeping with changes of peritonitis. Pancreas: Unremarkable Spleen: Unremarkable Adrenals/Urinary Tract: Adrenal glands are unremarkable. Kidneys are normal, without renal calculi, focal lesion, or hydronephrosis. Bladder is unremarkable. Stomach/Bowel: 8 right lower quadrant percutaneous drainage catheter has been placed extending into the mid pelvis with large evacuation of the previously noted dominant pelvic fluid collection. A residual pancake like fluid collection is identified measuring roughly 3.8 x 14.6 cm in greatest dimension measuring only 11 mm thick. The stomach, small bowel, and large bowel are unremarkable. Mild ascites is present, stable when compared to prior examination. Vascular/Lymphatic: Moderate aortoiliac atherosclerotic calcification. No aortic aneurysm. No pathologic adenopathy. Reproductive: Prostate is unremarkable. Other: There is interval development of mild diffuse subcutaneous edema as well as mild retroperitoneal edema, best appreciated in the presacral space, in keeping with  changes of mild anasarca. No abdominal wall hernia. Rectum unremarkable. Musculoskeletal: No acute bone abnormality. IMPRESSION: Interval percutaneous drainage of the dominant pelvic fluid collection with near complete evacuation of this collection. Thin pancake like residual collection persists. Interval percutaneous drainage of the right subhepatic fluid collection with residual 5.4 cm rim enhancing collection remaining in continuity with the percutaneous drainage catheter. Interval development of a recurrent 7.7 cm right hepatic abscess. While this appears in continuity with the percutaneous drainage catheter, its interval increase in size suggests either loculation and exclusion from the drainage catheter or malfunction of the drainage catheter. Clinical correlation is advised. Persistent changes of peritonitis. Anasarca with stable bilateral pleural effusions and ascites but increasing diffuse subcutaneous body wall edema and retroperitoneal edema. Aortic Atherosclerosis (ICD10-I70.0). Electronically Signed   By: AFidela SalisburyMD   On: 02/07/2021 01:40   ECHOCARDIOGRAM COMPLETE  Result Date: 02/05/2021    ECHOCARDIOGRAM REPORT   Patient Name:   Maurice TANDate of Exam: 02/05/2021 Medical Rec #:  [id number]     Height:       66.0 in Accession #:    [id number]    Weight:       155.2 lb Date of Birth:  12/30/1960/03/17     BSA:          1.795 m Patient Age:    89years       BP:  120/87 mmHg Patient Gender: M              HR:           101 bpm. Exam Location:  Inpatient Procedure: 2D Echo, Cardiac Doppler and Color Doppler Indications:    Abnormal ECG 794.31 / R94.31  History:        Patient has prior history of Echocardiogram examinations.                 Signs/Symptoms:Fever; Risk Factors:Diabetes. Large hepatic                 abscess grew E. coli and Bacteroides fragilis.  Sonographer:    Darlina Sicilian RDCS Referring Phys: [id number] [place]  1. Left ventricular ejection fraction,  by estimation, is 60 to 65%. The left ventricle has normal function. The left ventricle has no regional wall motion abnormalities. Left ventricular diastolic parameters were normal.  2. Right ventricular systolic function is normal. The right ventricular size is normal.  3. Moderate pleural effusion in the left lateral region.  4. The mitral valve is normal in structure. No evidence of mitral valve regurgitation. No evidence of mitral stenosis.  5. The aortic valve has an indeterminant number of cusps. Aortic valve regurgitation is not visualized. Mild aortic valve sclerosis is present, with no evidence of aortic valve stenosis.  6. The inferior vena cava is normal in size with greater than 50% respiratory variability, suggesting right atrial pressure of 3 mmHg. FINDINGS  Left Ventricle: Left ventricular ejection fraction, by estimation, is 60 to 65%. The left ventricle has normal function. The left ventricle has no regional wall motion abnormalities. The left ventricular internal cavity size was normal in size. There is  no left ventricular hypertrophy. Left ventricular diastolic parameters were normal. Right Ventricle: The right ventricular size is normal.Right ventricular systolic function is normal. Left Atrium: Left atrial size was normal in size. Right Atrium: Right atrial size was normal in size. Pericardium: There is no evidence of pericardial effusion. Mitral Valve: The mitral valve is normal in structure. No evidence of mitral valve regurgitation. No evidence of mitral valve stenosis. Tricuspid Valve: The tricuspid valve is normal in structure. Tricuspid valve regurgitation is trivial. No evidence of tricuspid stenosis. Aortic Valve: The aortic valve has an indeterminant number of cusps. Aortic valve regurgitation is not visualized. Mild aortic valve sclerosis is present, with no evidence of aortic valve stenosis. Pulmonic Valve: The pulmonic valve was not well visualized. Pulmonic valve regurgitation is  not visualized. No evidence of pulmonic stenosis. Aorta: The aortic root is normal in size and structure. Venous: The inferior vena cava is normal in size with greater than 50% respiratory variability, suggesting right atrial pressure of 3 mmHg.  Additional Comments: There is a moderate pleural effusion in the left lateral region.  LEFT VENTRICLE PLAX 2D LVIDd:         3.70 cm  Diastology LVIDs:         2.20 cm  LV e' medial:    7.40 cm/s LV PW:         0.80 cm  LV E/e' medial:  7.5 LV IVS:        0.70 cm  LV e' lateral:   12.00 cm/s LVOT diam:     1.90 cm  LV E/e' lateral: 4.6 LV SV:         47 LV SV Index:   26 LVOT Area:     2.84 cm  RIGHT  VENTRICLE RV S prime:     17.20 cm/s TAPSE (M-mode): 1.3 cm LEFT ATRIUM             Index       RIGHT ATRIUM          Index LA diam:        2.60 cm 1.45 cm/m  RA Area:     8.27 cm LA Vol (A2C):   28.3 ml 15.76 ml/m RA Volume:   13.10 ml 7.30 ml/m LA Vol (A4C):   24.1 ml 13.42 ml/m LA Biplane Vol: 26.8 ml 14.93 ml/m  AORTIC VALVE LVOT Vmax:   104.00 cm/s LVOT Vmean:  62.100 cm/s LVOT VTI:    0.166 m  AORTA Ao Root diam: 3.00 cm MITRAL VALVE MV Area (PHT): 4.60 cm    SHUNTS MV Decel Time: 165 msec    Systemic VTI:  0.17 m MV E velocity: 55.30 cm/s  Systemic Diam: 1.90 cm MV A velocity: 70.30 cm/s MV E/A ratio:  0.79 Kirk Ruths MD Electronically signed by Kirk Ruths MD Signature Date/Time: 02/05/2021/11:08:09 AM    Final     Labs:  CBC: Recent Labs    02/05/21 0343 02/06/21 0328 02/07/21 0343 02/08/21 0411  WBC 8.8 8.4 7.9 7.7  HGB 8.7* 8.9* 8.7* 9.0*  HCT 27.6* 28.1* 27.5* 28.3*  PLT 462* 489* 502* 499*    COAGS: No results for input(s): INR, APTT in the last 8760 hours.  BMP: Recent Labs    02/05/21 0343 02/06/21 0328 02/07/21 0343 02/08/21 0411  NA 131* 130* 131* 133*  K 3.9 3.8 3.8 4.0  CL 97* 96* 99 97*  CO2 '28 26 27 28  ' GLUCOSE 115* 135* 159* 145*  BUN 6 5* 7 5*  CALCIUM 7.3* 7.6* 7.6* 7.9*  CREATININE 0.51* 0.50* 0.55* 0.56*   GFRNONAA >60 >60 >60 >60    LIVER FUNCTION TESTS: Recent Labs    02/02/21 0417 02/03/21 0515 02/04/21 0330  BILITOT 0.6 0.5 0.4  AST 16 14* 19  ALT '23 17 18  ' ALKPHOS 94 76 81  PROT 5.1* 5.1* 5.3*  ALBUMIN 1.2* 1.2* 1.2*    Assessment and Plan:  60 y/o M s/p hepatic abscess drain placed at [facility] on 4/15 with additional hepatic abscess and intra-abdominal abscess drains placed yesterday in IR.   Drain labeled #1 (original hepatic abscess drain) - minimal output on presentation (just emptied per patient), after flushing/aspirating return of 20 cc thick purulent output. Bulb was changed from original one with stop cock to tubing/bulb that matches others so it can't accidentally be turned off which happened previously. Per I/O 85 cc output in last 24 hours. Instructed patient to flush and gently aspirate this drain if there is minimal output once Maurice Hodges's home, if it still is not putting anything out or is leaking/painful then Maurice Hodges will call our office to discuss possible drain upsizing.   Drain labeled #2 (RUQ hepatic abscess drain) - minimal output on presentation (just emptied per patient), flushes/aspirates easily with return of slightly cloudy yellow output. Per I/O 35 cc output in last 24 hours.   Drain labeled #3 (RLQ intra-abdominal abscess drain) - minimal output on presentation (just emptied per patient), flushes aspirates easily return of bloody purulent material. Per I/O 36 cc output in last 24 hours.   All insertion sites are clean/dry/dressed appropriately -- I have asked floor staff to make sure Maurice Hodges has fresh dressings prior to going home.  Culture of aspirate from second hepatic  abscess drain placed 4/21 shows NGTD.  Patient for d/c today -- reviewed CT from yesterday with IR attending, Dr. Annamaria Boots, today who does not recommend new drain placement given output from current drains is appropriate and it appears to communicate with existing drain. We discussed drain care and  demonstrated flushing/aspirating with patient and his son who both state understanding. We also discussed when to call our office vs when to present to the ED (fever, significant abdominal pain, persistent n/v, etc.). We will see patient in 10-14 days at our clinic for follow up imaging. Maurice Hodges was given drain card and several flushes, I have also sent in rx for flushes to his pharmacy.  Electronically Signed: Joaquim Nam, PA-C 02/08/2021, 10:51 AM   I spent a total of 25 Minutes at the the patient's bedside AND on the patient's hospital floor or unit, greater than 50% of which was counseling/coordinating care for hepatic abscess drain x 2 and intra-abdominal abscess drain x 1 follow up.

## 2021-02-09 ENCOUNTER — Other Ambulatory Visit (HOSPITAL_COMMUNITY): Payer: Self-pay

## 2021-02-09 ENCOUNTER — Other Ambulatory Visit: Payer: Self-pay | Admitting: Internal Medicine

## 2021-02-09 DIAGNOSIS — B9689 Other specified bacterial agents as the cause of diseases classified elsewhere: Secondary | ICD-10-CM

## 2021-02-09 DIAGNOSIS — K651 Peritoneal abscess: Secondary | ICD-10-CM

## 2021-02-17 ENCOUNTER — Other Ambulatory Visit (HOSPITAL_COMMUNITY): Payer: Self-pay | Admitting: Internal Medicine

## 2021-02-17 ENCOUNTER — Ambulatory Visit (HOSPITAL_COMMUNITY)
Admission: RE | Admit: 2021-02-17 | Discharge: 2021-02-17 | Disposition: A | Discharge status: Home / Self Care | Payer: Managed Care, Other (non HMO) | Source: Ambulatory Visit | Attending: Internal Medicine | Admitting: Internal Medicine

## 2021-02-17 ENCOUNTER — Other Ambulatory Visit: Payer: Self-pay

## 2021-02-17 DIAGNOSIS — M26622 Arthralgia of left temporomandibular joint: Secondary | ICD-10-CM | POA: Insufficient documentation

## 2021-02-21 ENCOUNTER — Ambulatory Visit
Admission: RE | Admit: 2021-02-21 | Discharge: 2021-02-21 | Disposition: A | Discharge status: Home / Self Care | Payer: Managed Care, Other (non HMO) | Source: Ambulatory Visit | Attending: Physician Assistant | Admitting: Physician Assistant

## 2021-02-21 ENCOUNTER — Ambulatory Visit
Admission: RE | Admit: 2021-02-21 | Discharge: 2021-02-21 | Disposition: A | Discharge status: Home / Self Care | Payer: Managed Care, Other (non HMO) | Source: Ambulatory Visit | Attending: Internal Medicine | Admitting: Internal Medicine

## 2021-02-21 ENCOUNTER — Other Ambulatory Visit (HOSPITAL_COMMUNITY): Payer: Self-pay | Admitting: Radiology

## 2021-02-21 ENCOUNTER — Other Ambulatory Visit (HOSPITAL_COMMUNITY): Payer: Self-pay

## 2021-02-21 ENCOUNTER — Other Ambulatory Visit: Payer: Self-pay | Admitting: Interventional Radiology

## 2021-02-21 DIAGNOSIS — K651 Peritoneal abscess: Secondary | ICD-10-CM

## 2021-02-21 DIAGNOSIS — K750 Abscess of liver: Secondary | ICD-10-CM

## 2021-02-21 DIAGNOSIS — B9689 Other specified bacterial agents as the cause of diseases classified elsewhere: Secondary | ICD-10-CM

## 2021-02-21 HISTORY — PX: IR RADIOLOGIST EVAL & MGMT: IMG5224

## 2021-02-21 IMAGING — RF DG SINUS / FISTULA TRACT / ABSCESSOGRAM
3 series · 9 of 9 positions shown · non-contrast
Comparison: none

INDICATION: 60-year-old gentleman with hepatic and abdominal abscesses presents
to interventional radiology for abscessogram and possible drain
removal.

[Series 1: sequence · 4 of 58 frames shown (1 of 2)]
[frame 1/58]
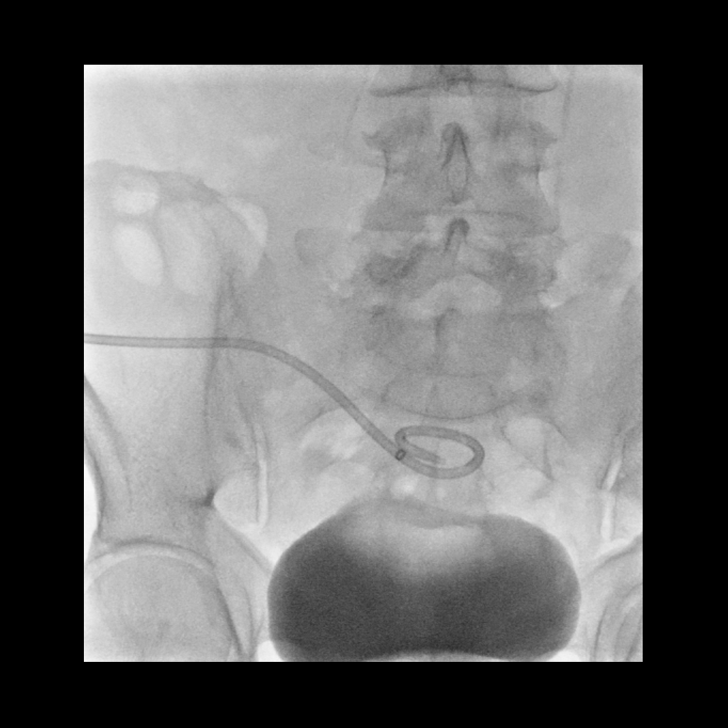
[frame 9/58]
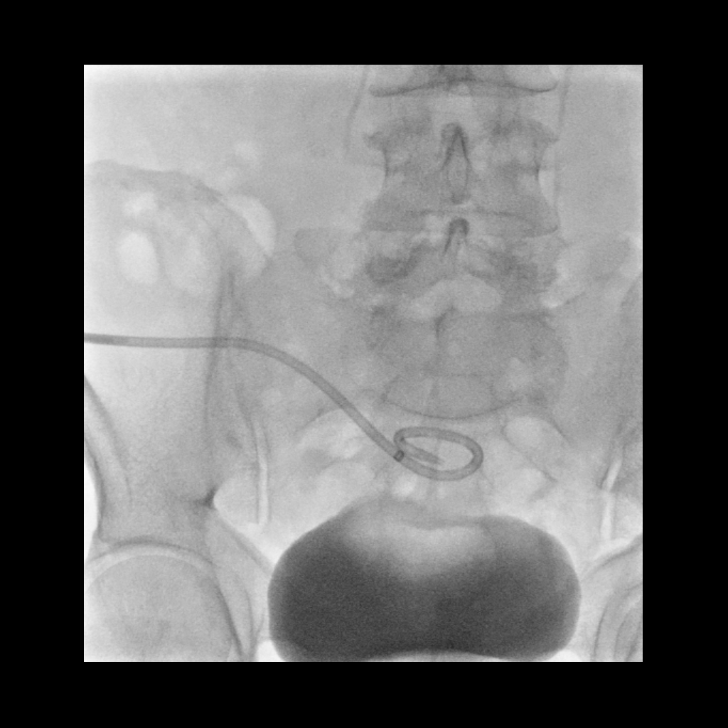
[frame 30/58]
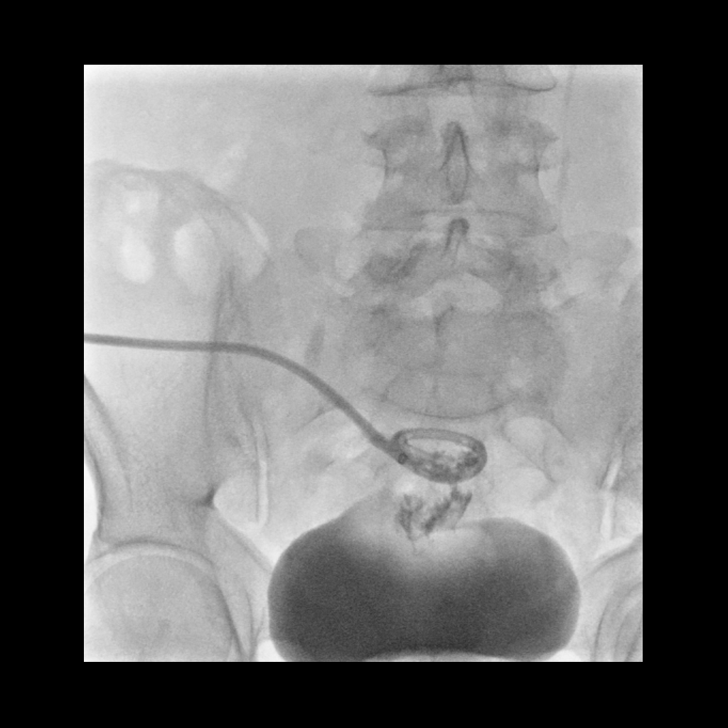
[frame 50/58]
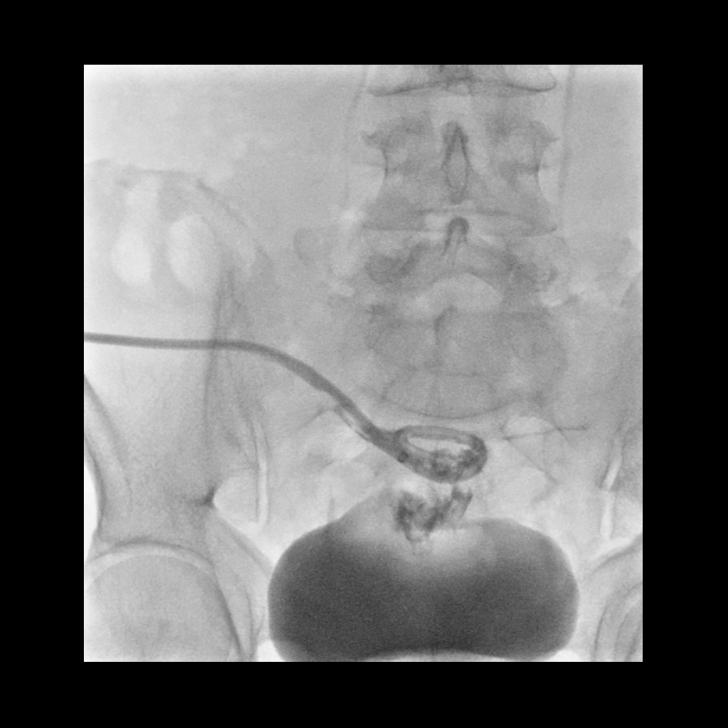

[Series 2: one shot · 1 of 1 slices shown]
[im 1/1]
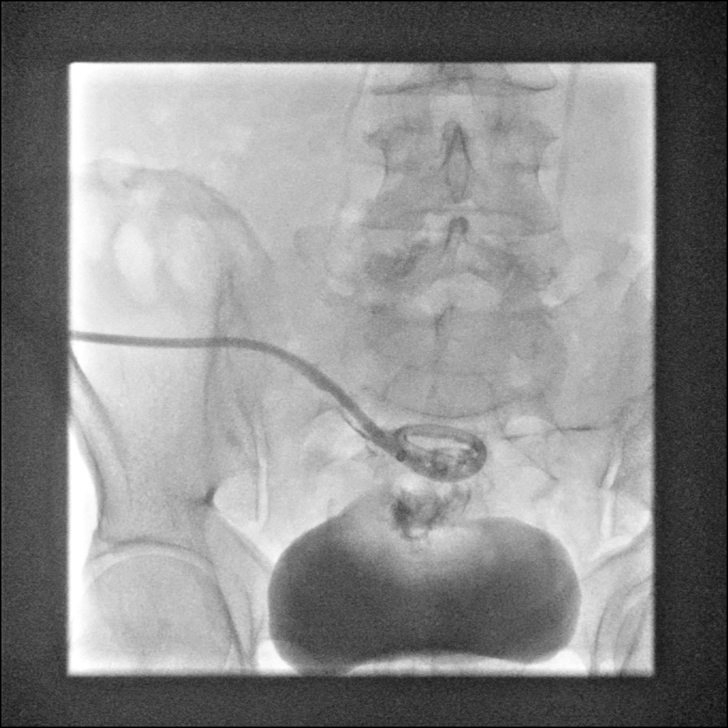

[Series 3: sequence · 4 of 43 frames shown (2 of 2)]
[frame 6/43]
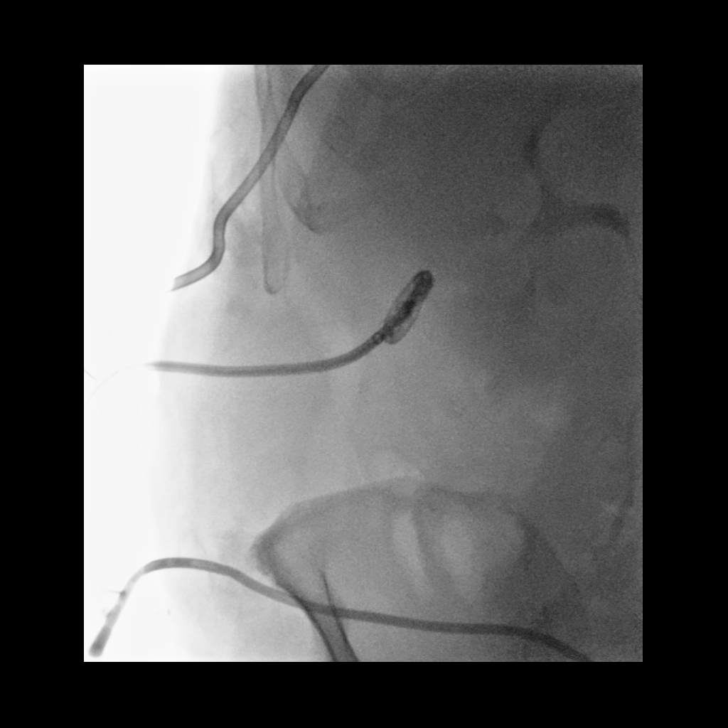
[frame 7/43]
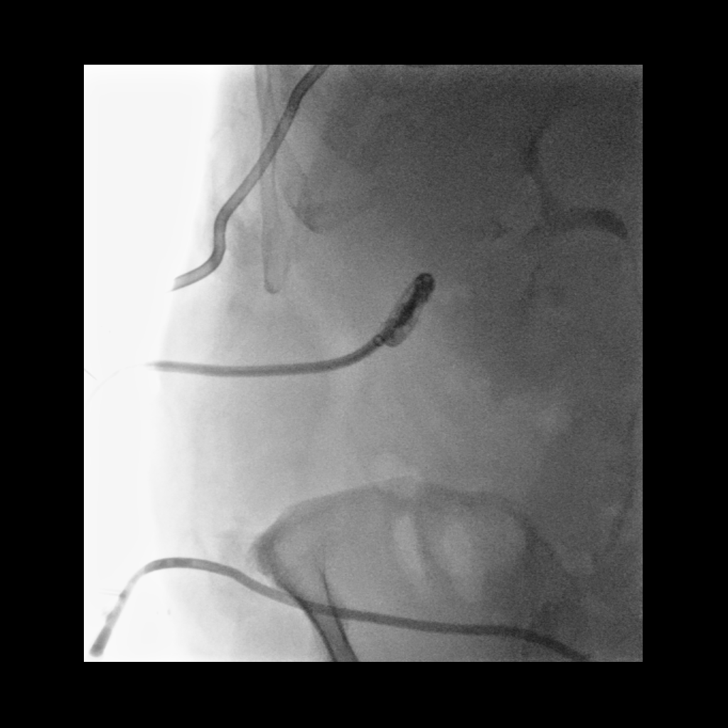
[frame 22/43]
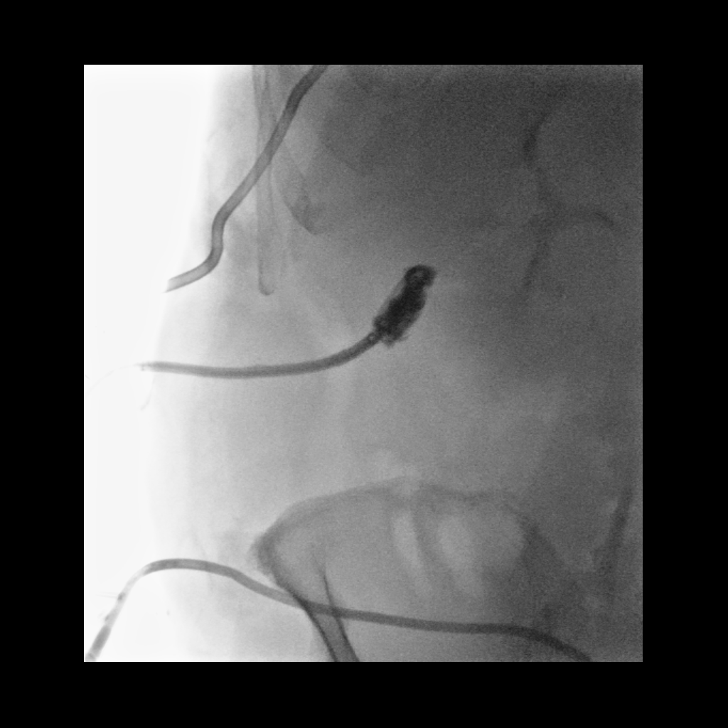
[frame 37/43]
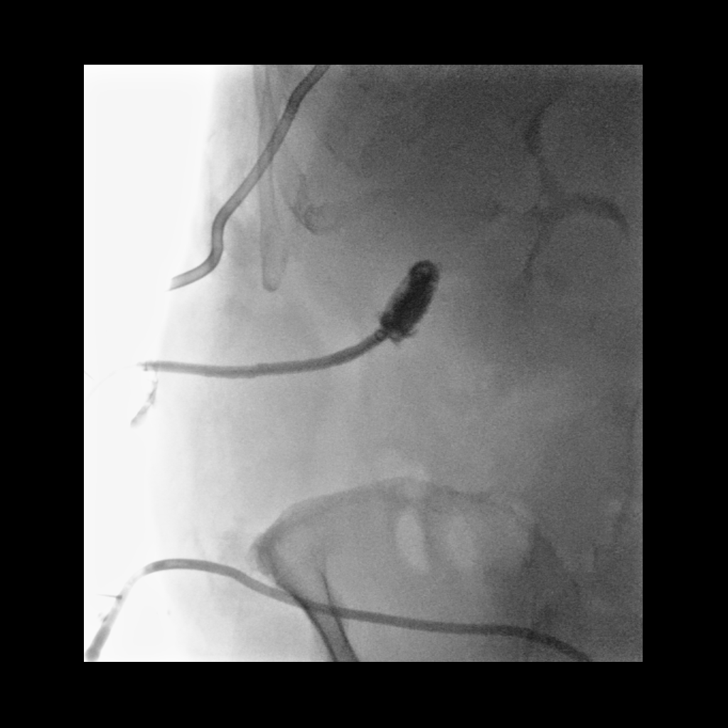

[9 of 9 positions shown; findings below may reference images not displayed]

EXAM:
1. Pelvic drain abscessogram and drain removal
2. Right upper quadrant abdominal abscessogram and drain removed

MEDICATIONS:
The patient is currently admitted to the hospital and receiving
intravenous antibiotics. The antibiotics were administered within an
appropriate time frame prior to the initiation of the procedure.

ANESTHESIA/SEDATION:
None

COMPLICATIONS:
None immediate.

PROCEDURE:
Scout image showed the midline pelvic drain in appropriate position.
Contrast administered through the drain under fluoroscopy showed a
small residual cavity with no fistulous communication. Drain cut and
removed.

Scout image demonstrated the right upper quadrant drain in
appropriate position. Contrast administered through the drain under
fluoroscopy showed no significant residual cavity and no fistulous
communication. Drain cut and removed.
IMPRESSION: Right upper quadrant abdominal and midline pelvic drains removed
given no significant residual cavity or fistulous communication.

## 2021-02-21 MED ORDER — IOPAMIDOL (ISOVUE-300) INJECTION 61%
100.0000 mL | Freq: Once | INTRAVENOUS | Status: AC | PRN
  Administered 2021-02-21: 100 mL via INTRAVENOUS

## 2021-02-21 MED ORDER — NORMAL SALINE FLUSH 0.9 % IV SOLN
INTRAVENOUS | 0 refills | Status: DC
  Filled 2021-02-21: qty 300, 30d supply, fill #0

## 2021-02-21 NOTE — Progress Notes (Signed)
Referring Physician(s): Dr. Darnelle Maffucci   Chief Complaint: The patient is seen in follow up today s/p right hepatic lobe abscess drain placed on 4.15.22 at [facility]. And 2 additional abscess drains (inferior to the right hepatic lobe and RLQ) placed on 4.21.21  History of present illness: 60 y.o. male outpatient. History of uncontrolled DM. Found to be hyperglycemic while being worked up for abdominal pain and fevers and chills at urgent care. Maurice Hodges was transferred to [facility] where he was found to be septic with a large hepatic abscess. On 4.15.22 a hepatic abscess drain was placed at [facility]. Cultures from aspirate showed e.coli and bacteroides fragilis.  Patient was transferred to [facility] were additional abscesses were found. On 4.21.22 IR placed an intra-abdominal abscess drain to a fluid collection inferior to the right hepatic lobe (drain #2). And a second  intra-abdominal abscess drain in the right lower abdominal fluid collection that crosses midline and also decompressing the left lower abdominal fluid collection (drain #3). Cultures from the second and third drain placement showed no growth. patient is being followed by infectious disease and gastroenterology. he was discharged on 6 weeks of IV Rocephin and Flagyl po BID.   Maurice Hodges is reporting scant output to drain # 2 the fluid collection inferior to the right hepatic lobe and drain # 3 and the RLQ abscess drain. Output to the hepatic abscess varies from 20 ml to 0.2 ml daily. All drains are to suction and he is flushing the approximately 10 ml's daily.  Output in drain # 2 and #3 are serosanguinous nature with less than 3 ml's noted to be in the suction device. Drain # 1 is purulent in nature with approximately 20 ml of thick yellow output noted to be in the JP drain. He denies any diarhea or fevers. Currently without any significant complaints. Patient alert,  calm and comfortable. Denies any fevers, headache,  chest pain, SOB, cough, abdominal pain, nausea, vomiting or bleeding. Nephew at bedside states that Maurice Hodges had jaw pain that he was seen at his PCP for. The jaw pain has since self resolved. Mr Hodges reports overall improvement in his condition.  Past Medical History:  Diagnosis Date  . Diabetes mellitus without complication ([place])     No past surgical history on file.  Allergies: Patient has no known allergies.  Medications: Prior to Admission medications   Medication Sig Start Date End Date Taking? Authorizing Provider  atorvastatin (LIPITOR) 40 MG tablet Take 40 mg by mouth daily. 11/21/20   [provider]  cefTRIAXone (ROCEPHIN) IVPB Inject 2 g into the vein daily. Indication:  Hepatic Abscess First Dose: Yes Last Day of Therapy:  03/16/21 Labs - Once weekly:  CBC/D and BMP, Labs - Every other week:  ESR and CRP Method of administration: IV Push Method of administration may be changed at the discretion of home infusion pharmacist based upon assessment of the patient and/or caregiver's ability to self-administer the medication ordered. 02/08/21 03/17/21  Terrilee Croak, MD  gabapentin (NEURONTIN) 300 MG capsule Take 1 capsule by mouth 3 (three) times daily. 11/22/20   [provider]  glipiZIDE (GLUCOTROL XL) 2.5 MG 24 hr tablet Take 1 tablet (2.5 mg total) by mouth daily with breakfast. 02/09/21 05/10/21  Dahal, Marlowe Aschoff, MD  metFORMIN (GLUCOPHAGE) 1000 MG tablet Take 1 tablet (1,000 mg total) by mouth 2 (two) times daily. 02/08/21 05/09/21  Terrilee Croak, MD  metroNIDAZOLE (FLAGYL) 500 MG tablet Take 1 tablet (  500 mg total) by mouth every 12 (twelve) hours. 02/08/21 03/17/21  Terrilee Croak, MD  oxyCODONE (OXY IR/ROXICODONE) 5 MG immediate release tablet Take 1 tablet (5 mg total) by mouth every 4 (four) hours as needed for moderate pain. 02/08/21   Terrilee Croak, MD  sodium chloride 0.9 % injection Flush each drain with 5 mL QD. 02/08/21   Candiss Norse A, PA-C  Sodium  Chloride Flush (NORMAL SALINE FLUSH) 0.9 % SOLN Use 5 mLs in each drain daily. (3 drains total =43m) 02/08/21   WJoaquim Nam PA-C     Family History  Problem Relation Age of Onset  . Diabetes Other   . Heart attack Neg Hx   . Stroke Neg Hx     Social History   Socioeconomic History  . Marital status: Married    Spouse name: Not on file  . Number of children: Not on file  . Years of education: Not on file  . Highest education level: Not on file  Occupational History  . Not on file  Tobacco Use  . Smoking status: Never Smoker  . Smokeless tobacco: Never Used  Substance and Sexual Activity  . Alcohol use: Not Currently  . Drug use: Never  . Sexual activity: Not on file  Other Topics Concern  . Not on file  Social History Narrative  . Not on file   Social Determinants of Health   Financial Resource Strain: Not on file  Food Insecurity: Not on file  Transportation Needs: Not on file  Physical Activity: Not on file  Stress: Not on file  Social Connections: Not on file     Vital Signs: There were no vitals taken for this visit.  Physical Exam Vitals and nursing note reviewed.  Constitutional:      Appearance: He is well-developed.  HENT:     Head: Normocephalic.  Pulmonary:     Effort: Pulmonary effort is normal.  Abdominal:     Comments: All drains are to JP suction. He is flushing the approximately 10 ml's daily.  Output in drain # 2 and #3 are seriosanginous nature with less than 3 ml's noted to be in the suction device. Drain # 1 is purulent in nature with approximately 20 ml of thick yellow output noted to be in the JP. Suture and state lock in place. Some purulent output noted around the exit site of the right hepatic abscess drain ( #1). Otherwise sites are unremarkable with no erythema, edema, tenderness, bleeding enoted at exit site.     Musculoskeletal:        General: Normal range of motion.     Cervical back: Normal range of motion.  Skin:     General: Skin is dry.  Neurological:     Mental Status: He is alert and oriented to person, place, and time.     Imaging: DG Mandible 4 Views  Result Date: 02/20/2021 CLINICAL DATA:  Left temporomandibular joint tenderness.  No injury. EXAM: MANDIBLE - 4+ VIEW COMPARISON:  None. FINDINGS: No fracture or bone lesion. Temporomandibular joints appear normally spaced and aligned. No degenerative/arthropathic change. Soft tissues are unremarkable. Sinuses are clear. IMPRESSION: Negative. Electronically Signed   By: DLajean ManesM.D.   On: 02/20/2021 09:54    Labs:  CBC: Recent Labs    02/05/21 0343 02/06/21 0328 02/07/21 0343 02/08/21 0411  WBC 8.8 8.4 7.9 7.7  HGB 8.7* 8.9* 8.7* 9.0*  HCT 27.6* 28.1* 27.5* 28.3*  PLT 462* 489* 502* 499*  COAGS: No results for input(s): INR, APTT in the last 8760 hours.  BMP: Recent Labs    02/05/21 0343 02/06/21 0328 02/07/21 0343 02/08/21 0411  NA 131* 130* 131* 133*  K 3.9 3.8 3.8 4.0  CL 97* 96* 99 97*  CO2 '28 26 27 28  ' GLUCOSE 115* 135* 159* 145*  BUN 6 5* 7 5*  CALCIUM 7.3* 7.6* 7.6* 7.9*  CREATININE 0.51* 0.50* 0.55* 0.56*  GFRNONAA >60 >60 >60 >60    LIVER FUNCTION TESTS: Recent Labs    02/02/21 0417 02/03/21 0515 02/04/21 0330  BILITOT 0.6 0.5 0.4  AST 16 14* 19  ALT '23 17 18  ' ALKPHOS 94 76 81  PROT 5.1* 5.1* 5.3*  ALBUMIN 1.2* 1.2* 1.2*    Assessment:  60 y.o. male outpatient. History of uncontrolled DM. Found to be hyperglycemic while being worked up for abdominal pain and fevers and chills at urgent care. Mr. Galvin Proffer was transferred to [facility] where he was found to be septic with a large hepatic abscess. On 4.15.22 a hepatic abscess drain was placed at [facility]. Cultures from aspirate showed e.coli and bacteroides fragilis.  Patient was transferred to [facility] were additional abscesses were found. On 4.21.22 IR placed an intra-abdominal abscess drain to a fluid collection inferior to the right  hepatic lobe (drain #2) and a second  intra-abdominal abscess drain in the right lower abdominal fluid collection that crosses midline and also decompressing the left lower abdominal fluid collection (drain #3). Cultures from the second and third drain placement showed no growth. patient is being followed by infectious disease and gastroenterology. He was discharged on 6 weeks of IV Rocephin and Flagyl po BID.   Mr. Captain is reporting scant output to drain # 2 the fluid collection inferior to the right hepatic lobe and drain # 3 and the RLQ abscess drain. Output to the hepatic abscess varies from 20 ml to 0.2 ml daily. All drains are to JP suction. He is flushing the approximately 10 ml's daily.  Output in drain # 2 and #3 are serosanguinous nature with less than 3 ml's noted to be in the suction device. Drain # 1 is purulent in nature with approximately 20 ml of thick yellow output noted to be in the JP drain. He denies any diarhea or fevers. Currently without any significant complaints. Patient alert,  calm and comfortable. Denies any fevers, headache, chest pain, SOB, cough, abdominal pain, nausea, vomiting or bleeding. Nephew at bedside states that mr. Stuller had jaw pain that he was seen at his PCP for jaw pain that has since self resolved. Mr Robben reports overall improvement in his condition.     Mr. Biela presents today for a drain follow up X 3. He continues to be on IV rocephin and Flagyl po. Patient alert, calm and comfortable. Denies any fevers, headache, chest pain, SOB, cough, abdominal pain, nausea, vomiting or bleeding. Nephew at bedside states that mr. Porrata had jaw pain that he was seen at his PCP for. The jaw pain has since self resolved. Mr Goeller reports overall improvement in his condition. He is  reporting scant output to drain # 2 the fluid collection inferior to the right hepatic lobe and drain # 3 the RLQ drain. Output to the hepatic abscess varies from 20 ml to 0.2 ml  daily. All drains are to suction and he is flushing the approximately 10 ml's daily.  Output in drain # 2 and #3 are serosanguinous nature with  less than 3 ml's noted to be in the suction device. Drain # 1 is purulent in nature with approximately 20 ml of thick yellow output noted to be in the JP drain. Some purulent drainage noted to be around the exit site of the right hepatic abscess drain  (# 1). Otherwise the sites are unremarkable with no erythema, edema, tenderness, bleeding or drainage noted at exit site.    Subsequent imaging including a CT abdomen pelvis and follow-up drain injection obtained on 5.10.22 reveals resolution of abscesses # 2 inferior to right hepatic lobe and drain # 3 the  RLQ abscess drain. Drain # 1 the right hepatic abscess is persistent but improving. Abscessogram of drains # 2 inferior to right hepatic lobe and drain # 3 the  RLQ abscess drain show resolution of the abscesses. Images were reviewed by Dr. Dwaine Gale who recommends drain removal of  2 inferior to right hepatic lobe and drain # 3 the RLQ abscess drain. Education on aggressive irrigation for the remaining right hepatic abscess provided to the Patient and his nephew at beside.  Please see attestation from Dr. Dwaine Gale regarding additional findings and care plan. Regarding the removed drains # 2 inferior to right hepatic lobe and drain # 3 the  RLQ abscess drains. Drain removed intact, no complications,  patient tolerated procedure well, dressing applied to exit site.   Post-removal instructions: 1- Okay  to shower/sponge bath 24 hours post-removal. 2- No submerging (swimming, bathing) for 7 days post-removal. 3- Change dressing PRN until site fully healed.  Patient and nephew instructed to continue recording output and continue with gauze dressing changes every 1 to 2 days for remaining drain.  Patient will be scheduled for 2 week follow-up with CT abd (no pelvis) and possible abscessogram. Education on agressive irrigation  for the remaining right hepatic abscess provided to the Patient and his nephew at beside.  Both patient and nephew verbalized understanding of the education and are in agreement with the plan of care. A 30 day supply of flushes was prescribied. Both the Patient and his nephew understand to keep this appointment if the drain remains and there are no interval changes in his care   Patient was instructed that if drain output is minimal over a 3-4 day span she should contact IR clinic to arrange for earlier follow up appointment time.  Signed: Jacqualine Mau, NP 02/21/2021, 1:06 PM   Please refer to Dr. Dwaine Gale attestation of this note for management and plan.

## 2021-02-23 ENCOUNTER — Other Ambulatory Visit: Payer: Self-pay | Admitting: Internal Medicine

## 2021-02-23 DIAGNOSIS — K750 Abscess of liver: Secondary | ICD-10-CM

## 2021-02-23 DIAGNOSIS — T8143XA Infection following a procedure, organ and space surgical site, initial encounter: Secondary | ICD-10-CM

## 2021-02-28 ENCOUNTER — Telehealth: Payer: Self-pay

## 2021-02-28 ENCOUNTER — Ambulatory Visit (INDEPENDENT_AMBULATORY_CARE_PROVIDER_SITE_OTHER): Payer: Managed Care, Other (non HMO) | Admitting: Internal Medicine

## 2021-02-28 ENCOUNTER — Other Ambulatory Visit: Payer: Self-pay

## 2021-02-28 ENCOUNTER — Encounter: Payer: Self-pay | Admitting: Internal Medicine

## 2021-02-28 VITALS — BP 127/78 | HR 94 | Wt 134.0 lb

## 2021-02-28 DIAGNOSIS — K750 Abscess of liver: Secondary | ICD-10-CM | POA: Diagnosis not present

## 2021-02-28 MED ORDER — CEFDINIR 300 MG PO CAPS: 300.0000 mg | ORAL_CAPSULE | Freq: Two times a day (BID) | ORAL | 1 refills | Status: AC

## 2021-02-28 MED ORDER — METRONIDAZOLE 500 MG PO TABS: 500.0000 mg | ORAL_TABLET | Freq: Two times a day (BID) | ORAL | 0 refills | Status: AC

## 2021-02-28 NOTE — Progress Notes (Signed)
Maurice Hodges for Infectious Disease  Patient Active Problem List   Diagnosis Date Noted  . Intra-abdominal abscess ([place])   . Abdominal fluid collection   . Central venous catheter in place   . Bacterial liver abscess 02/01/2021  . Uncontrolled type II diabetes mellitus ([place]) 02/01/2021  . Hypokalemia 02/01/2021  . AKI (acute kidney injury) ([place]) 02/01/2021  . Severe sepsis ([place]) 02/01/2021  . Normocytic anemia 02/01/2021  . Liver abscess due to bacteria 02/01/2021      Subjective:    Patient ID: Maurice Hodges, male    DOB: 03-Sep-1961, 60 y.o.   MRN: [id number]  Chief Complaint  Patient presents with  . Follow-up    HPI:  60 y.o. male history of diabetes mellitus and liver abscess here for f/u    Background: From dr Tommy Medal 4/21 inpatient consult note: Sepsis for a month, seen at [facility] who diagnosed him with having strep throat based on a rapid strep antigen test.  Note the documentation in the note though Truddie Crumble states that his oropharynx was clear.  The patient self tell me that his tongue had developed a white coating he remembers.  He was prescribed Augmentin.  In the interim he worsened and came back to the urgent care on the 4/13 with severe abdominal discomfort.  He had quite elevated transaminases as well as a blood sugar greater than 600.  He was referred to the urgent care at [facility] to the emergency department.  In the ER he is initially hemodynamically stable but then became hypotensive.  He was given fluid boluses.  CT scan showed a large hepatic abscess.  He underwent percutaneous drainage on 15 April 12/04/2020:  Cultures in the abscess grew E. coli and Bacteroides fragilis.  The E. coli apparently is been sent to the [facility] which is quite baffling to me.  He had been on Zosyn there.  The patient himself tells me that the drain became shot due to a switch with locking system on his tubing that he showed me.  When  it was turned to shot of course it did not drain anymore and he became systemically ill with drenching sweats and fevers.  Apparently the following day one of the physicians noticed this and reopened his drain which immediately filled up with purulent material.  In the interim he was switched over to meropenem.  He has developed additional intra-abdominal abscesses and has been transferred to [facility] for further evaluation and management.  Interventional radiology of seeing the patient and placed 2 drain 1 into a loculated collection around the inferior right hepatic lobe which yielded 50 mL of cloudy fluid on aspiration and a second 1 in the lower abdomen that the clip collects with a pelvic collection of fluid with 325 mL aspirated and drain placed.  Cultures have been sent by radiology as well.  The patient himself denies ever having had trouble with his gallbladder.  He has never had appendicitis or diverticulitis.  He is eager to have water and also to eat.  He had a PICC line in place which is visible on the right arm.  He is sister comments that it was placed last Thursday.  He should have cleared his Bacteroides on Zosyn and that by that time.  ------------ 5/17 id clinic f/u Doing well The ecoli never was able to be grown Improved weight Eating well 5/10 repeat abd ct improved abscess size No n/v/diarrhea/rash/abd pain  IR to repeat ct in 1-2 weeks    No Known Allergies    Outpatient Medications Prior to Visit  Medication Sig Dispense Refill  . cefTRIAXone (ROCEPHIN) IVPB Inject 2 g into the vein daily. Indication:  Hepatic Abscess First Dose: Yes Last Day of Therapy:  03/16/21 Labs - Once weekly:  CBC/D and BMP, Labs - Every other week:  ESR and CRP Method of administration: IV Push Method of administration may be changed at the discretion of home infusion pharmacist based upon assessment of the patient and/or caregiver's ability to  self-administer the medication ordered. 37 Units 0  . metroNIDAZOLE (FLAGYL) 500 MG tablet Take 1 tablet (500 mg total) by mouth every 12 (twelve) hours. 74 tablet 0  . atorvastatin (LIPITOR) 40 MG tablet Take 40 mg by mouth daily.    Marland Kitchen gabapentin (NEURONTIN) 300 MG capsule Take 1 capsule by mouth 3 (three) times daily.    Marland Kitchen glipiZIDE (GLUCOTROL XL) 2.5 MG 24 hr tablet Take 1 tablet (2.5 mg total) by mouth daily with breakfast. 30 tablet 2  . metFORMIN (GLUCOPHAGE) 1000 MG tablet Take 1 tablet (1,000 mg total) by mouth 2 (two) times daily. 60 tablet 2  . oxyCODONE (OXY IR/ROXICODONE) 5 MG immediate release tablet Take 1 tablet (5 mg total) by mouth every 4 (four) hours as needed for moderate pain. 30 tablet 0  . sodium chloride 0.9 % injection Flush each drain with 5 mL QD. 5 mL 3  . Sodium Chloride Flush (NORMAL SALINE FLUSH) 0.9 % SOLN Use 5 mLs in each drain daily. (3 drains total =15m) 300 mL 0  . Sodium Chloride Flush (NORMAL SALINE FLUSH) 0.9 % SOLN use as directed 300 mL 0   No facility-administered medications prior to visit.     Social History   Socioeconomic History  . Marital status: Married    Spouse name: Not on file  . Number of children: Not on file  . Years of education: Not on file  . Highest education level: Not on file  Occupational History  . Not on file  Tobacco Use  . Smoking status: Never Smoker  . Smokeless tobacco: Never Used  Substance and Sexual Activity  . Alcohol use: Not Currently  . Drug use: Never  . Sexual activity: Not on file  Other Topics Concern  . Not on file  Social History Narrative  . Not on file   Social Determinants of Health   Financial Resource Strain: Not on file  Food Insecurity: Not on file  Transportation Needs: Not on file  Physical Activity: Not on file  Stress: Not on file  Social Connections: Not on file  Intimate Partner Violence: Not on file      Review of Systems    All other ros negative    Objective:     BP 127/78   Pulse 94   Wt 134 lb (60.8 kg)   BMI 21.63 kg/m  Nursing note and vital signs reviewed.  Physical Exam  General/constitutional: no distress, pleasant HEENT: Normocephalic, PER, Conj Clear, EOMI, Oropharynx clear Neck supple CV: rrr no mrg Lungs: clear to auscultation, normal respiratory effort Abd: Soft, Nontender; jp drain with minimal purulent output Ext: no edema Skin: No Rash Neuro: nonfocal MSK: no peripheral joint swelling/tenderness/warmth; back spines nontender   Central line presence: left ue picc site no redness/tenderness/purulence    Labs:  Micro: 4/21 ir drain abscess cx negative (while already on abx)  Serology:  Imaging: 5/10 abd pelv ct  1. Interval resolution of perihepatic right upper quadrant abscess. 2. Near complete resolution of midline pelvic abscess. 3. Interval decrease in size of right hepatic abscess with residual collection measuring 5.1 x 4.8 x 4.2 cm compared to 7.9 x 6.0 x 6.7 cm on prior CT.   Assessment & Plan:   Problem List Items Addressed This Visit   None   Visit Diagnoses    Hepatic abscess    -  Primary      CC: Hepatic abscess  Lines: 4/14-c rue triple lumen picc  Abx: 4/25-c ceftriaxone 4/25-c metronidazole  4/20-25 meropenem 4/13-20 piptazo  ASSESSMENT: -intraabd abscesses with severe sepsis -Polymicrobial hepatic abscesses initial management [facility]; s/p 4/15 perc drain liver abscess placement; cx ecoli/bacteroides -Bacteroides bacteremia 4/13 in setting liver abscess   60 yo male dm2 admitted 4/20 in transfer from [facility] with percutaneous drain valve shut off and feeling poorly including having fever and new intraabdominal abscesses  4/20 osh ct with improving hepatic abscess but 2 new abscesses (1 is prerectal and 1 is subscapsular right hepatic lobe)  He had undergone here 2 more drains into the 2 other abscesses found on 4/20. The previously placed perc drain  valve is opened and functioning now  The 4/21 drain cx is negative.   I agree the septic picture is due to ?accidentally shut off perc drain and source control rather than abx failure   Of note, no obvious stone/biliary obstruction found. Will need age appropriate cancer screening  -------- 4/26 assessment Continues to do well after switch from meropenem to ceftriaxone yesterday Outside hospital culture continues to not be able to grow the [facility] cx here negative  Clinically doing much better. Repeat ct 4/26 showed right hepatic fluid collection in continuity with previous but increased size ??? Loculation. Pending IR reevaluation  Reasonable to arrange opat and plan outpatient f/u with ID, once IR reevaluated  5/17 assessment Reviewed abd ct on 5/10 with him. Improved abscess size; jp drain functioning well draining 1 bulb a day. Clinically improving and tolerating abx    PLAN: 1. Continue ceftriaxone 2 gram iv daily; and PO metronidazole (can do twice daily dosing) 500 mg 2. Finish ceftriaxone on 6/02; on that date continue metronidazole as is, and add cefdinir 300 mg po bid 3. Follow up with me in 4-6 weeks 4. Continue drain management and repeat ct by IR (patient reports ct planned for 1-2 weeks)    Follow-up: Return for 4-6 weeks with dr Jp Eastham.  I spent more than 20 minute reviewing data/chart, and coordinating care and >50% direct face to face time providing counseling/discussing diagnostics/treatment plan with patient      Jabier Mutton, [place] [facility] [place] [phone]  pager   [phone] cell 02/28/2021, 2:11 PM

## 2021-02-28 NOTE — Telephone Encounter (Signed)
Verbal orders given to [facility] with [facility] to pull PICC after completion of abx on 6/2. Orders repeated correctly. Patient aware of end date.   Maurice Hodges Loyola Mast, RN

## 2021-02-28 NOTE — Patient Instructions (Signed)
Your abscess is down to 5 cm from 8 cm a month ago   Finish your intravenous antibiotics (ceftriaxone) on 6/02 Continue the pill antibiotics metronidazole 1 tablet twice a day   On 6/02, the picc is to be pulled. But you'll still continue metronidazole antibiotics pill, AND START cefdinir antibiotic pill as well 300 mg twice a day until see by me again    Follow up with me around 4 to 6 weeks  Continue drain management and repeat ct scan by your radiologist

## 2021-03-07 ENCOUNTER — Encounter: Payer: Self-pay | Admitting: *Deleted

## 2021-03-07 ENCOUNTER — Ambulatory Visit
Admission: RE | Admit: 2021-03-07 | Discharge: 2021-03-07 | Disposition: A | Discharge status: Home / Self Care | Payer: Managed Care, Other (non HMO) | Source: Ambulatory Visit | Attending: Internal Medicine | Admitting: Internal Medicine

## 2021-03-07 DIAGNOSIS — K750 Abscess of liver: Secondary | ICD-10-CM

## 2021-03-07 HISTORY — PX: IR RADIOLOGIST EVAL & MGMT: IMG5224

## 2021-03-07 MED ORDER — IOPAMIDOL (ISOVUE-300) INJECTION 61%
100.0000 mL | Freq: Once | INTRAVENOUS | Status: AC | PRN
  Administered 2021-03-07: 100 mL via INTRAVENOUS

## 2021-03-07 NOTE — Progress Notes (Signed)
Referring Physician(s): Vu,Trung T  Chief Complaint: The patient is seen in follow up today s/p hepatic abscess drain placed [facility] 01/27/21. Development of intraabdominal abscesses.--- RUQ abscess drain and RLQ abscess drain placed in IR 02/02/21   History of present illness:  RUQ drain ans RLQ drain removed 02/21/21 Hepatic abscess drain remains Here today for CT evaluation and possible removal Pt denies fever, chills Denies abd pain Flushes daily 10 cc sterile saline Op still 15-20 cc daily    Past Medical History:  Diagnosis Date  . Diabetes mellitus without complication [facility])     Past Surgical History:  Procedure Laterality Date  . IR RADIOLOGIST EVAL & MGMT  02/21/2021    Allergies: Patient has no known allergies.  Medications: Prior to Admission medications   Medication Sig Start Date End Date Taking? Authorizing Provider  atorvastatin (LIPITOR) 40 MG tablet Take 40 mg by mouth daily. 11/21/20   [provider]  cefdinir (OMNICEF) 300 MG capsule Take 1 capsule (300 mg total) by mouth 2 (two) times daily. 02/28/21 03/30/21  Vu, Johnny Bridge T, MD  cefTRIAXone (ROCEPHIN) IVPB Inject 2 g into the vein daily. Indication:  Hepatic Abscess First Dose: Yes Last Day of Therapy:  03/16/21 Labs - Once weekly:  CBC/D and BMP, Labs - Every other week:  ESR and CRP Method of administration: IV Push Method of administration may be changed at the discretion of home infusion pharmacist based upon assessment of the patient and/or caregiver's ability to self-administer the medication ordered. 02/08/21 03/17/21  Terrilee Croak, MD  gabapentin (NEURONTIN) 300 MG capsule Take 1 capsule by mouth 3 (three) times daily. 11/22/20   [provider]  glipiZIDE (GLUCOTROL XL) 2.5 MG 24 hr tablet Take 1 tablet (2.5 mg total) by mouth daily with breakfast. 02/09/21 05/10/21  Dahal, Marlowe Aschoff, MD  metFORMIN (GLUCOPHAGE) 1000 MG tablet Take 1 tablet (1,000 mg total) by mouth 2 (two) times daily.  02/08/21 05/09/21  Terrilee Croak, MD  metroNIDAZOLE (FLAGYL) 500 MG tablet Take 1 tablet (500 mg total) by mouth every 12 (twelve) hours. 02/08/21 03/17/21  Terrilee Croak, MD  metroNIDAZOLE (FLAGYL) 500 MG tablet Take 1 tablet (500 mg total) by mouth 2 (two) times daily. 02/28/21 03/30/21  Vu, Johnny Bridge T, MD  oxyCODONE (OXY IR/ROXICODONE) 5 MG immediate release tablet Take 1 tablet (5 mg total) by mouth every 4 (four) hours as needed for moderate pain. 02/08/21   Terrilee Croak, MD  sodium chloride 0.9 % injection Flush each drain with 5 mL QD. 02/08/21   Candiss Norse A, PA-C  Sodium Chloride Flush (NORMAL SALINE FLUSH) 0.9 % SOLN Use 5 mLs in each drain daily. (3 drains total =64m) 02/08/21   WCandiss NorseA, PA-C  Sodium Chloride Flush (NORMAL SALINE FLUSH) 0.9 % SOLN use as directed 02/21/21        Family History  Problem Relation Age of Onset  . Diabetes Other   . Heart attack Neg Hx   . Stroke Neg Hx     Social History   Socioeconomic History  . Marital status: Married    Spouse name: Not on file  . Number of children: Not on file  . Years of education: Not on file  . Highest education level: Not on file  Occupational History  . Not on file  Tobacco Use  . Smoking status: Never Smoker  . Smokeless tobacco: Never Used  Substance and Sexual Activity  . Alcohol use: Not Currently  . Drug use: Never  .  Sexual activity: Not on file  Other Topics Concern  . Not on file  Social History Narrative  . Not on file   Social Determinants of Health   Financial Resource Strain: Not on file  Food Insecurity: Not on file  Transportation Needs: Not on file  Physical Activity: Not on file  Stress: Not on file  Social Connections: Not on file     Vital Signs: There were no vitals taken for this visit.  Physical Exam Skin:    General: Skin is warm.     Comments: Site of drain is clean and dry NT no bleeding OP in JP is cloudy 15 cc in JP Tubing of JP with pus like material in  it Pt is unable to definitively tell me if pus material is new or old    CT today shows near complete resolution of hepatic abscess per Dr Laurence Ferrari  Imaging: No results found.  Labs:  CBC: Recent Labs    02/05/21 0343 02/06/21 0328 02/07/21 0343 02/08/21 0411  WBC 8.8 8.4 7.9 7.7  HGB 8.7* 8.9* 8.7* 9.0*  HCT 27.6* 28.1* 27.5* 28.3*  PLT 462* 489* 502* 499*    COAGS: No results for input(s): INR, APTT in the last 8760 hours.  BMP: Recent Labs    02/05/21 0343 02/06/21 0328 02/07/21 0343 02/08/21 0411  NA 131* 130* 131* 133*  K 3.9 3.8 3.8 4.0  CL 97* 96* 99 97*  CO2 _0 GLUCOSE 115* 135* 159* 145*  BUN 6 5* 7 5*  CALCIUM 7.3* 7.6* 7.6* 7.9*  CREATININE 0.51* 0.50* 0.55* 0.56*  GFRNONAA >60 >60 >60 >60    LIVER FUNCTION TESTS: Recent Labs    02/02/21 0417 02/03/21 0515 02/04/21 0330  BILITOT 0.6 0.5 0.4  AST 16 14* 19  ALT _1 ALKPHOS 94 76 81  PROT 5.1* 5.1* 5.3*  ALBUMIN 1.2* 1.2* 1.2*    Assessment:  Hepatic abscess near completely resolved per Dr Laurence Ferrari OP in bulb is cloudy 15-20 cc daily Material in tubing is pus appearing-- cannot definitively conclude old or new. Discussed with Dr Laurence Ferrari I changed out JP and tubing DC flushes Return 1 week--- just to evaluate drainage. If OP clear--- can remove drain. Pt and family have good understanding and are agreeable to plan   Signed: Lavonia Drafts, PA-C 03/07/2021, 2:24 PM   Please refer to Dr. Laurence Ferrari attestation of this note for management and plan.

## 2021-03-08 ENCOUNTER — Other Ambulatory Visit: Payer: Self-pay | Admitting: Internal Medicine

## 2021-03-08 DIAGNOSIS — K651 Peritoneal abscess: Secondary | ICD-10-CM

## 2021-03-08 DIAGNOSIS — K750 Abscess of liver: Secondary | ICD-10-CM

## 2021-03-08 DIAGNOSIS — B9689 Other specified bacterial agents as the cause of diseases classified elsewhere: Secondary | ICD-10-CM

## 2021-03-10 ENCOUNTER — Telehealth: Payer: Self-pay

## 2021-03-10 NOTE — Telephone Encounter (Signed)
-----   Message from Raymondo Band, MD sent at 03/10/2021 10:16 AM EDT ----- Hi team  Patient ct scan abdomen shows abscess essentially resolved  Please let patient know and home health he can stop the iv ceftriaxone. Continue flagyl, and start cefdinir on top of flagyl.  Continue the 2 antibiotic pills for 1 week after his drain is removed by IR   See me in 3 to 4 weeks  Thanks

## 2021-03-10 NOTE — Telephone Encounter (Signed)
Spoke with patient and patient's son to relay per Dr. Renold Don that his CT shows that the abscess has essentially resolved. Advised Dr. Renold Don would like the patient to stop IV ceftriaxone and would like him to take his 2 pills, flagyl and cefdinir, and to continue them for 1 week after his drain is removed.   Drain removal is scheduled for 03/14/21, therefore RN advised to continue flagyl and cefdinir through 03/21/21. Patient's son verbalized understanding. He states he has no further questions, advised him to please call if he has any questions or concerns.   Scheduled patient with Dr. Elinor Parkinson 03/31/21 for 3 week follow-up.   Gave verbal orders to [facility] per Dr. Renold Don that okay to stop IV ceftriaxone and pull PICC. Orders repeated and verified.   Sandie Ano, RN

## 2021-03-12 NOTE — Telephone Encounter (Signed)
Thanks Aundra Millet   Yes what you said

## 2021-03-14 ENCOUNTER — Ambulatory Visit
Admission: RE | Admit: 2021-03-14 | Discharge: 2021-03-14 | Disposition: A | Discharge status: Home / Self Care | Payer: Managed Care, Other (non HMO) | Source: Ambulatory Visit | Attending: Internal Medicine | Admitting: Internal Medicine

## 2021-03-14 ENCOUNTER — Telehealth: Payer: Self-pay | Admitting: *Deleted

## 2021-03-14 ENCOUNTER — Encounter: Payer: Self-pay | Admitting: *Deleted

## 2021-03-14 DIAGNOSIS — K651 Peritoneal abscess: Secondary | ICD-10-CM

## 2021-03-14 DIAGNOSIS — B9689 Other specified bacterial agents as the cause of diseases classified elsewhere: Secondary | ICD-10-CM

## 2021-03-14 HISTORY — PX: IR RADIOLOGIST EVAL & MGMT: IMG5224

## 2021-03-14 NOTE — Progress Notes (Signed)
Referring Physician(s): Vu, Trung T  Chief Complaint: The patient is seen in follow up today s/p right hepatic lobe abscess drain placed 01/27/21 at [facility]. Two additional abscess drains (inferior to the right hepatic lobe and RLQ) were placed 02/02/21  History of present illness:  Maurice Hodges, 60 year old male, has a medical history significant for DM. He presented to an Urgent Care in April 2022 with abdominal pain, fevers and chills and was found to be septic with a large hepatic abscess. An abscess drain was placed 01/27/21 by a [place]. Cultures from the aspirate showed e.coli and bacteroides fragilis. He was transferred to [facility] for further care and additional hepatic abscess were found. On 02/02/21 IR placed an intra-abdominal abscess drain into a fluid collection inferior to the right hepatic lobe. Another drain was placed to the RLQ. He was ultimately discharged home with IV and oral antibiotics.   He first presented to the outpatient clinic 02/21/21 and CT imaging that day showed resolution of the two drains placed by IR. The hepatic abscess was persistent but improving. The two drains placed by IR were removed; the hepatic abscess drain was left intact.   The patient followed up again on 03/07/21. CT Imaging showed the hepatic abscess to be almost completely resolved and he reported a drain output of 15-20 ml daily. The output was pus-appearing and the decision was made to leave the drain in for one more week and for the patient to discontinue flushing the drain.   Mr. Rumbold presents today to the clinic with a male family member. He reports scant daily output which is clear/serous. He denies pain or discomfort, he is afebrile and he states he has a good appetite/good energy levels.    Past Medical History:  Diagnosis Date  . Diabetes mellitus without complication [facility])     Past Surgical History:  Procedure Laterality Date  . IR RADIOLOGIST EVAL & MGMT   02/21/2021  . IR RADIOLOGIST EVAL & MGMT  03/07/2021    Allergies: Patient has no known allergies.  Medications: Prior to Admission medications   Medication Sig Start Date End Date Taking? Authorizing Provider  atorvastatin (LIPITOR) 40 MG tablet Take 40 mg by mouth daily. 11/21/20   [provider]  cefdinir (OMNICEF) 300 MG capsule Take 1 capsule (300 mg total) by mouth 2 (two) times daily. 02/28/21 03/30/21  Vu, Johnny Bridge T, MD  cefTRIAXone (ROCEPHIN) IVPB Inject 2 g into the vein daily. Indication:  Hepatic Abscess First Dose: Yes Last Day of Therapy:  03/16/21 Labs - Once weekly:  CBC/D and BMP, Labs - Every other week:  ESR and CRP Method of administration: IV Push Method of administration may be changed at the discretion of home infusion pharmacist based upon assessment of the patient and/or caregiver's ability to self-administer the medication ordered. 02/08/21 03/17/21  Terrilee Croak, MD  gabapentin (NEURONTIN) 300 MG capsule Take 1 capsule by mouth 3 (three) times daily. 11/22/20   [provider]  glipiZIDE (GLUCOTROL XL) 2.5 MG 24 hr tablet Take 1 tablet (2.5 mg total) by mouth daily with breakfast. 02/09/21 05/10/21  Dahal, Marlowe Aschoff, MD  metFORMIN (GLUCOPHAGE) 1000 MG tablet Take 1 tablet (1,000 mg total) by mouth 2 (two) times daily. 02/08/21 05/09/21  Terrilee Croak, MD  metroNIDAZOLE (FLAGYL) 500 MG tablet Take 1 tablet (500 mg total) by mouth every 12 (twelve) hours. 02/08/21 03/17/21  Terrilee Croak, MD  metroNIDAZOLE (FLAGYL) 500 MG tablet Take 1 tablet (500 mg total) by  mouth 2 (two) times daily. 02/28/21 03/30/21  Vu, Johnny Bridge T, MD  oxyCODONE (OXY IR/ROXICODONE) 5 MG immediate release tablet Take 1 tablet (5 mg total) by mouth every 4 (four) hours as needed for moderate pain. 02/08/21   Terrilee Croak, MD  sodium chloride 0.9 % injection Flush each drain with 5 mL QD. 02/08/21   Candiss Norse A, PA-C  Sodium Chloride Flush (NORMAL SALINE FLUSH) 0.9 % SOLN Use 5 mLs in each drain  daily. (3 drains total =22m) 02/08/21   WCandiss NorseA, PA-C  Sodium Chloride Flush (NORMAL SALINE FLUSH) 0.9 % SOLN use as directed 02/21/21        Family History  Problem Relation Age of Onset  . Diabetes Other   . Heart attack Neg Hx   . Stroke Neg Hx     Social History   Socioeconomic History  . Marital status: Married    Spouse name: Not on file  . Number of children: Not on file  . Years of education: Not on file  . Highest education level: Not on file  Occupational History  . Not on file  Tobacco Use  . Smoking status: Never Smoker  . Smokeless tobacco: Never Used  Substance and Sexual Activity  . Alcohol use: Not Currently  . Drug use: Never  . Sexual activity: Not on file  Other Topics Concern  . Not on file  Social History Narrative  . Not on file   Social Determinants of Health   Financial Resource Strain: Not on file  Food Insecurity: Not on file  Transportation Needs: Not on file  Physical Activity: Not on file  Stress: Not on file  Social Connections: Not on file     Vital Signs: There were no vitals taken for this visit.  Physical Exam Constitutional:      General: He is not in acute distress. Pulmonary:     Effort: Pulmonary effort is normal.  Abdominal:     Palpations: Abdomen is soft.     Tenderness: There is no abdominal tenderness.     Comments: RUQ drain to suction. Suture and stat-lock in place. Scant amount of dried drainage at skin insertion site. Patient denies any pain or tenderness.   Skin:    General: Skin is warm and dry.  Neurological:     Mental Status: He is alert and oriented to person, place, and time.     Imaging: No results found.  Labs:  CBC: Recent Labs    02/05/21 0343 02/06/21 0328 02/07/21 0343 02/08/21 0411  WBC 8.8 8.4 7.9 7.7  HGB 8.7* 8.9* 8.7* 9.0*  HCT 27.6* 28.1* 27.5* 28.3*  PLT 462* 489* 502* 499*    COAGS: No results for input(s): INR, APTT in the last 8760 hours.  BMP: Recent  Labs    02/05/21 0343 02/06/21 0328 02/07/21 0343 02/08/21 0411  NA 131* 130* 131* 133*  K 3.9 3.8 3.8 4.0  CL 97* 96* 99 97*  CO2 _0 GLUCOSE 115* 135* 159* 145*  BUN 6 5* 7 5*  CALCIUM 7.3* 7.6* 7.6* 7.9*  CREATININE 0.51* 0.50* 0.55* 0.56*  GFRNONAA >60 >60 >60 >60    LIVER FUNCTION TESTS: Recent Labs    02/02/21 0417 02/03/21 0515 02/04/21 0330  BILITOT 0.6 0.5 0.4  AST 16 14* 19  ALT _1 ALKPHOS 94 76 81  PROT 5.1* 5.1* 5.3*  ALBUMIN 1.2* 1.2* 1.2*    Assessment:  Right hepatic  lobe abscess s/p drain placement 01/27/21 at [facility]:   The patient reports scant daily output for the past week and what little has been drained is clear/serous. He is afebrile and denies any pain or discomfort. The drain was removed without incident. The patient was instructed to keep the site clean and dry and to change the dressing daily or as needed until the site heals. He was instructed to not submerge the site for at least one week and to wait 24 hours before showering.   The patient still has a right upper arm PICC but states he has completed his IV antibiotic regimen. The patient asked when this could be removed and I referred him to Dr. Gale Journey - the Infectious Disease doctor. I gave the patient Dr. Hart [place] clinic number and he stated he would follow up with Dr. Gale Journey today.   The patient knows he can call the clinic with any questions or concerns.   Signed: Theresa Duty, NP 03/14/2021, 2:05 PM   Please refer to Dr. Margaretmary Dys attestation of this note for management and plan.

## 2021-03-14 NOTE — Telephone Encounter (Signed)
Patietn had GI drain pulled at [facility] today. He walked across the lobby to [facility] to have his PICC pulled. Clinic unable to pull picc at this time. RN confirmed with [facility] that home health nursing has order to pull picc at home. Nursing to contact/schedule PICC pull. Andree Coss, RN

## 2021-03-31 ENCOUNTER — Other Ambulatory Visit: Payer: Self-pay

## 2021-03-31 ENCOUNTER — Encounter: Payer: Self-pay | Admitting: Infectious Diseases

## 2021-03-31 ENCOUNTER — Ambulatory Visit (INDEPENDENT_AMBULATORY_CARE_PROVIDER_SITE_OTHER): Payer: Managed Care, Other (non HMO) | Admitting: Infectious Diseases

## 2021-03-31 VITALS — BP 147/85 | HR 80 | Temp 97.9°F | Wt 143.0 lb

## 2021-03-31 DIAGNOSIS — K750 Abscess of liver: Secondary | ICD-10-CM | POA: Diagnosis not present

## 2021-03-31 DIAGNOSIS — Z5181 Encounter for therapeutic drug level monitoring: Secondary | ICD-10-CM | POA: Diagnosis not present

## 2021-03-31 NOTE — Progress Notes (Signed)
[facility]                                                             [address], [place], [place], [address]                                                                  Phn. [phone]; Fax: [phone]                                                                             Date: 03/31/21  Reason for Follow up   Assessment Problem List Items Addressed This Visit       Digestive   Hepatic abscess - Primary   Relevant Orders   CBC   Comprehensive metabolic panel   C-reactive protein   Sedimentation rate   Other Visit Diagnoses     Medication monitoring encounter           Hepatic abscess, pelvic abscess s/p appropriate tx with antibiotics and drain placement ( removed ) - resolved  Medication Monitoring  Plan Patient has already completed adequate course of tx for hepatic abscess which has resolved Advised patient to monitor abdominal pain for worsening, new fevers, chills, nausea/vomiting at which time he will need to seek immediate attention for repeat scan of his abdomen. He verbalised understanding He already has a fu appointment with Dr Renold Don on 7/20 at 2:45 pm   All questions and concerns were discussed and addressed. Patient verbalized understanding of the plan. ____________________________________________________________________________________________________________________  HPI/Interval Events 03/31/21 Here for follow up for hepatic abscess. Patient previously seen by Dr Daiva Eves and Dr Renold Don. Prior notes reviewed.   5/24 CT abdomen/pelvis with interval resolution of the fluid component of the intrahepatic abscess with residual lymphedema in the hepatic parenchyma.  Similar appearance of thrombosis of a branch of the middle hepatic vein likely sequelae of infection.  Stable moderately large right pleural effusion without interval change.  I  V ceftriaxone was  stopped after CT abdomen pelvis findings and he was continued on cefdinir and Flagyl per Dr Renold Don.  Hepatic drain was removed by IR on 5/31 given no output from the drain. Has completed 7 days of cefdinir and flagyl post drain removal   Patient is accompanied by his son.  Spoke with the help of phone Spanish interpreter.  Patient states he has completed 7 days of cefdinir and Flagyl after drain was removed.  Pain has been in the range of 2-3 out of 10 since drain was removed, however last night it was 6 out of 10 after drinking cold water.  He states the pain is 5 out of 10 now.  Denies any fevers, chills and sweats.  Denies any nausea or vomiting, constipation or diarrhea.  He has been eating good, denies any weight loss.  Overall he feels well after completing course of antibiotics and drain was removed.   ROS: Constitutional: Negative for fever, chills, activity change, appetite change, fatigue and unexpected weight change.  HENT: Negative for congestion, sore throat, rhinorrhea, sneezing, trouble swallowing and sinus pressure.  Eyes: Negative for photophobia and visual disturbance.  Respiratory: Negative for cough, chest tightness, shortness of breath, wheezing and stridor.  Cardiovascular: Negative for chest pain, palpitations and leg swelling.  Gastrointestinal: Negative for nausea, vomiting,diarrhea, constipation, blood in stool, abdominal distention and anal bleeding.  Genitourinary: Negative for dysuria, hematuria, flank pain and difficulty urinating.  Musculoskeletal: Negative for myalgias, back pain, joint swelling, arthralgias and gait problem.  Skin: Negative for color change, pallor, rash and wound.  Neurological: Negative for dizziness, tremors, weakness and light-headedness.  Hematological: Negative for adenopathy. Does not bruise/bleed easily.  Psychiatric/Behavioral: Negative for behavioral problems, confusion, sleep disturbance, dysphoric mood, decreased concentration and agitation.    Past Medical History:  Diagnosis Date   Diabetes mellitus without complication [facility])    Past Surgical History:  Procedure Laterality Date   IR RADIOLOGIST EVAL & MGMT  02/21/2021   IR RADIOLOGIST EVAL & MGMT  03/07/2021   IR RADIOLOGIST EVAL & MGMT  03/14/2021   Current Outpatient Medications on File Prior to Visit  Medication Sig Dispense Refill   atorvastatin (LIPITOR) 40 MG tablet Take 40 mg by mouth daily.     gabapentin (NEURONTIN) 300 MG capsule Take 1 capsule by mouth 3 (three) times daily.     glipiZIDE (GLUCOTROL XL) 2.5 MG 24 hr tablet Take 1 tablet (2.5 mg total) by mouth daily with breakfast. 30 tablet 2   metFORMIN (GLUCOPHAGE) 1000 MG tablet Take 1 tablet (1,000 mg total) by mouth 2 (two) times daily. 60 tablet 2   oxyCODONE (OXY IR/ROXICODONE) 5 MG immediate release tablet Take 1 tablet (5 mg total) by mouth every 4 (four) hours as needed for moderate pain. 30 tablet 0   sodium chloride 0.9 % injection Flush each drain with 5 mL QD. 5 mL 3   Sodium Chloride Flush (NORMAL SALINE FLUSH) 0.9 % SOLN Use 5 mLs in each drain daily. (3 drains total =81ml) 300 mL 0   Sodium Chloride Flush (NORMAL SALINE FLUSH) 0.9 % SOLN use as directed 300 mL 0   No current facility-administered medications on file prior to visit.   No Known Allergies  Social History   Socioeconomic History   Marital status: Married    Spouse name: Not on file   Number of children: Not on file   Years of education: Not on file   Highest education level: Not on file  Occupational History   Not on file  Tobacco Use   Smoking status: Never   Smokeless tobacco: Never  Substance and Sexual Activity   Alcohol use: Not Currently   Drug use: Never   Sexual activity: Not on file  Other Topics Concern   Not on file  Social History Narrative   Not on file   Social Determinants of Health   Financial Resource Strain: Not on file  Food Insecurity: Not on file  Transportation Needs: Not on file  Physical  Activity: Not on file  Stress: Not on file  Social Connections: Not on file  Intimate Partner Violence: Not on file   Family History  Problem Relation Age of Onset   Diabetes Other    Heart attack Neg Hx  Stroke Neg Hx      Vitals BP (!) 147/85   Pulse 80   Temp 97.9 F (36.6 C) (Oral)   Wt 143 lb (64.9 kg)   SpO2 98%   BMI 23.08 kg/m    Examination  General - not in acute distress, comfortably sitting in chair HEENT - PEERLA, no pallor and no icterus Chest - b/l clear air entry, no additional sounds CVS- Normal s1s2, RRR Abdomen - Soft, Non tender , non distended, BS+, drain has been removed  Ext- no pedal edema Neuro: grossly normal Back - WNL Psych : calm and cooperative   Recent labs CBC Latest Ref Rng & Units 02/08/2021 02/07/2021 02/06/2021  WBC 4.0 - 10.5 K/uL 7.7 7.9 8.4  Hemoglobin 13.0 - 17.0 g/dL 9.0(L) 8.7(L) 8.9(L)  Hematocrit 39.0 - 52.0 % 28.3(L) 27.5(L) 28.1(L)  Platelets 150 - 400 K/uL 499(H) 502(H) 489(H)   CMP Latest Ref Rng & Units 02/08/2021 02/07/2021 02/06/2021  Glucose 70 - 99 mg/dL 409(W145(H) 119(J159(H) 478(G135(H)  BUN 6 - 20 mg/dL 5(L) 7 5(L)  Creatinine 0.61 - 1.24 mg/dL 9.56(O0.56(L) 1.30(Q0.55(L) 6.57(Q0.50(L)  Sodium 135 - 145 mmol/L 133(L) 131(L) 130(L)  Potassium 3.5 - 5.1 mmol/L 4.0 3.8 3.8  Chloride 98 - 111 mmol/L 97(L) 99 96(L)  CO2 22 - 32 mmol/L 28 27 26   Calcium 8.9 - 10.3 mg/dL 7.9(L) 7.6(L) 7.6(L)  Total Protein 6.5 - 8.1 g/dL - - -  Total Bilirubin 0.3 - 1.2 mg/dL - - -  Alkaline Phos 38 - 126 U/L - - -  AST 15 - 41 U/L - - -  ALT 0 - 44 U/L - - -      Pertinent Microbiology Results for orders placed or performed during the hospital encounter of 02/01/21  Aerobic/Anaerobic Culture (surgical/deep wound)     Status: None   Collection Time: 02/02/21 12:43 PM   Specimen: Abscess; Abdominal Fluid  Result Value Ref Range Status   Specimen Description ABSCESS LIVER  Final   Special Requests DRAINAGE  Final   Gram Stain   Final    ABUNDANT WBC  PRESENT,BOTH PMN AND MONONUCLEAR NO ORGANISMS SEEN    Culture   Final    No growth aerobically or anaerobically. Performed at [facility], [address] [place], [place] [address]    Report Status 02/07/2021 FINAL  Final     Pertinent Imaging CT abdomen/pelvis 03/07/21  FINDINGS: Lower chest: Persistent and unchanged moderately large right-sided pleural effusion. Few septations are visible. Centrilobular emphysema is also visualized in the visualized portions of the lungs. Tiny calcified granuloma noted incidentally. The esophagus is mildly distended and filled with fluid. Calcifications present along the coronary arteries. The heart is normal in size. No pericardial effusion.   Hepatobiliary: Well-positioned percutaneous drainage catheter. Interval resolution of the fluid component of the previously identified hepatic abscess. There are a few bands of low attenuation likely representing areas of edema, and possibly infection related thrombosis of a branch of the middle hepatic vein. Similar findings were noted previously. No new abscess or intrahepatic fluid collection. No biliary ductal dilatation. Gallbladder is unremarkable. No intra or extrahepatic biliary ductal dilatation.   Pancreas: Unremarkable. No pancreatic ductal dilatation or surrounding inflammatory changes.   Spleen: Normal in size without focal abnormality.   Adrenals/Urinary Tract: Adrenal glands are unremarkable. Kidneys are normal, without renal calculi, focal lesion, or hydronephrosis. Bladder is unremarkable.   Stomach/Bowel: Stomach is within normal limits. No evidence of bowel wall thickening, distention,  or inflammatory changes.   Vascular/Lymphatic: Atherosclerotic calcifications in the abdominal aorta. No suspicious lymphadenopathy.   Other: No abdominal wall hernia or abnormality. No abdominopelvic ascites.   Musculoskeletal: No acute fracture or aggressive appearing lytic  or blastic osseous lesion.   IMPRESSION: 1. Interval resolution of the fluid component of the intrahepatic abscess with residual edema in the hepatic parenchyma. The drainage catheter remains well positioned. 2. Similar appearance of thrombosis of a branch of the middle hepatic vein, likely sequelae of infection. 3. Stable moderately large right pleural effusion without interval change. 4. Aortic Atherosclerosis (ICD10-I70.0) and Emphysema (ICD10-J43.9).    All pertinent labs/Imagings/notes reviewed. All pertinent plain films and CT images have been personally visualized and interpreted; radiology reports have been reviewed. Decision making incorporated into the Impression / Recommendations.  I have spent 60 minutes for this patient encounter including  review of prior medical records with greater than 50% of time in face to face counsel of the patient/discussing diagnostics and plan of care.   Electronically signed by:  Odette Fraction, MD Infectious Disease Physician [facility] [address] [place], [place] [address] Phone: [phone]  Fax: [phone]

## 2021-04-01 LAB — COMPREHENSIVE METABOLIC PANEL
AG Ratio: 1.2 (calc) (ref 1.0–2.5)
ALT: 14 U/L (ref 9–46)
AST: 17 U/L (ref 10–35)
Albumin: 4.3 g/dL (ref 3.6–5.1)
Alkaline phosphatase (APISO): 73 U/L (ref 35–144)
BUN/Creatinine Ratio: 30 (calc) — ABNORMAL HIGH (ref 6–22)
BUN: 18 mg/dL (ref 7–25)
CO2: 30 mmol/L (ref 20–32)
Calcium: 10.2 mg/dL (ref 8.6–10.3)
Chloride: 100 mmol/L (ref 98–110)
Creat: 0.61 mg/dL — ABNORMAL LOW (ref 0.70–1.33)
Globulin: 3.5 g/dL (calc) (ref 1.9–3.7)
Glucose, Bld: 144 mg/dL — ABNORMAL HIGH (ref 65–99)
Potassium: 4.5 mmol/L (ref 3.5–5.3)
Sodium: 139 mmol/L (ref 135–146)
Total Bilirubin: 0.3 mg/dL (ref 0.2–1.2)
Total Protein: 7.8 g/dL (ref 6.1–8.1)

## 2021-04-01 LAB — CBC
HCT: 46.5 % (ref 38.5–50.0)
Hemoglobin: 14.9 g/dL (ref 13.2–17.1)
MCH: 27.5 pg (ref 27.0–33.0)
MCHC: 32 g/dL (ref 32.0–36.0)
MCV: 85.8 fL (ref 80.0–100.0)
MPV: 11.2 fL (ref 7.5–12.5)
Platelets: 328 10*3/uL (ref 140–400)
RBC: 5.42 10*6/uL (ref 4.20–5.80)
RDW: 15.8 % — ABNORMAL HIGH (ref 11.0–15.0)
WBC: 8.1 10*3/uL (ref 3.8–10.8)

## 2021-04-01 LAB — SEDIMENTATION RATE: Sed Rate: 19 mm/h (ref 0–20)

## 2021-04-01 LAB — C-REACTIVE PROTEIN: CRP: 1 mg/L (ref <8.0)

## 2021-04-14 ENCOUNTER — Other Ambulatory Visit: Payer: Self-pay

## 2021-04-14 ENCOUNTER — Ambulatory Visit (INDEPENDENT_AMBULATORY_CARE_PROVIDER_SITE_OTHER): Payer: Managed Care, Other (non HMO) | Admitting: Endocrinology

## 2021-04-14 VITALS — BP 126/82 | HR 77 | Ht 66.0 in | Wt 144.2 lb

## 2021-04-14 DIAGNOSIS — E1165 Type 2 diabetes mellitus with hyperglycemia: Secondary | ICD-10-CM | POA: Diagnosis not present

## 2021-04-14 LAB — POCT GLYCOSYLATED HEMOGLOBIN (HGB A1C): Hemoglobin A1C: 6.2 % — AB (ref 4.0–5.6)

## 2021-04-14 MED ORDER — METFORMIN HCL ER 500 MG PO TB24: 2000.0000 mg | ORAL_TABLET | Freq: Every day | ORAL | 3 refills | Status: AC

## 2021-04-14 NOTE — Patient Instructions (Addendum)
good diet and exercise significantly improve the control of your diabetes.  please let me know if you wish to be referred to a dietician.  high blood sugar is very risky to your health.  you should see an eye doctor and dentist every year.  It is very important to get all recommended vaccinations.  Controlling your blood pressure and cholesterol drastically reduces the damage diabetes does to your body.  Those who smoke should quit.  Please discuss these with your doctor.  check your blood sugar once a day.  vary the time of day when you check, between before the 3 meals, and at bedtime.  also check if you have symptoms of your blood sugar being too high or too low.  please keep a record of the readings and bring it to your next appointment here (or you can bring the meter itself).  You can write it on any piece of paper.  please call us sooner if your blood sugar goes below 70, or if most of your readings are over 200. We will need to take this complex situation in stages.  For now, I have sent 2 prescriptions to your pharmacy: to change glipizide to "Rybelsus," and to change metformin to extended-release. Please come back for a follow-up appointment in 2 weeks, at 4:30PM.  Nadara Mode dieta y ejercicio mejoran significativamente el control de su diabetes. por favor hgamelo saber si desea ser referido a un dietista. el nivel alto de azcar en la sangre es muy peligroso para su salud. usted debe ver a un Child psychotherapist y Occupational hygienist. Es muy importante obtener todas las vacunas recomendadas. Controlar la presin arterial y el colesterol reduce drsticamente el dao que la diabetes le causa a su cuerpo. Los que fuman deben dejar de Media planner. Hable de esto con su mdico. controle su nivel de azcar en la sangre una vez al da. Vare la hora del da en la que realiza la [place], entre antes de las 3 comidas y antes de [place]. tambin verifique si tiene sntomas de que su nivel de azcar en la sangre es  demasiado alto o [place]. mantenga un registro de las lecturas y Nurse, adult a su prxima cita aqu (o puede traer Chief Executive Officer). Puedes escribirlo en cualquier hoja de papel. llmenos antes si su nivel de azcar en la sangre es inferior a 70 o si la mayora de sus lecturas superan los 200. Tendremos que abordar esta compleja situacin por etapas. Por ahora, he enviado 2 recetas a su farmacia: para cambiar la glipizida a "Rybelsus" y para cambiar la metformina a liberacin prolongada. Vuelva para una cita de seguimiento en 2 semanas, a las 4:30 p. m.

## 2021-04-14 NOTE — Progress Notes (Signed)
Subjective:    Patient ID: Maurice Hodges, male    DOB: 1960/12/06, 60 y.o.   MRN: [id number]  HPI pt is referred by Lenise Herald, PA, for diabetes.  Pt states DM was dx'ed in 2014; he is unaware of any chronic complications; he has been on insulin since; pt says his diet and exercise are good; he has never had pancreatitis, pancreatic surgery, severe hypoglycemia or DKA.  He takes 2 oral meds.  He says cbg's have improved to 86-113.     Past Medical History:  Diagnosis Date   Diabetes mellitus without complication [facility])     Past Surgical History:  Procedure Laterality Date   IR RADIOLOGIST EVAL & MGMT  02/21/2021   IR RADIOLOGIST EVAL & MGMT  03/07/2021   IR RADIOLOGIST EVAL & MGMT  03/14/2021    Social History   Socioeconomic History   Marital status: Married    Spouse name: Not on file   Number of children: Not on file   Years of education: Not on file   Highest education level: Not on file  Occupational History   Not on file  Tobacco Use   Smoking status: Never   Smokeless tobacco: Never  Substance and Sexual Activity   Alcohol use: Not Currently   Drug use: Never   Sexual activity: Not on file  Other Topics Concern   Not on file  Social History Narrative   Not on file   Social Determinants of Health   Financial Resource Strain: Not on file  Food Insecurity: Not on file  Transportation Needs: Not on file  Physical Activity: Not on file  Stress: Not on file  Social Connections: Not on file  Intimate Partner Violence: Not on file    Current Outpatient Medications on File Prior to Visit  Medication Sig Dispense Refill   atorvastatin (LIPITOR) 40 MG tablet Take 40 mg by mouth daily.     gabapentin (NEURONTIN) 300 MG capsule Take 1 capsule by mouth 3 (three) times daily.     oxyCODONE (OXY IR/ROXICODONE) 5 MG immediate release tablet Take 1 tablet (5 mg total) by mouth every 4 (four) hours as needed for moderate pain. 30 tablet 0   sodium chloride 0.9 %  injection Flush each drain with 5 mL QD. 5 mL 3   Sodium Chloride Flush (NORMAL SALINE FLUSH) 0.9 % SOLN Use 5 mLs in each drain daily. (3 drains total =62ml) 300 mL 0   Sodium Chloride Flush (NORMAL SALINE FLUSH) 0.9 % SOLN use as directed 300 mL 0   No current facility-administered medications on file prior to visit.    No Known Allergies  Family History  Problem Relation Age of Onset   Diabetes Other    Heart attack Neg Hx    Stroke Neg Hx     BP 126/82 (BP Location: Right Arm, Patient Position: Sitting, Cuff Size: Normal)   Pulse 77   Ht 5\' 6"  (1.676 m)   Wt 144 lb 3.2 oz (65.4 kg)   SpO2 97%   BMI 23.27 kg/m     Review of Systems denies weight loss, sob, n/v, and depression.     Objective:   Physical Exam Pulses: dorsalis pedis intact bilat.   MSK: no deformity of the feet CV: no leg edema Skin:  no ulcer on the feet.  normal color and temp on the feet. Neuro: sensation is intact to touch on the feet.    outside test results are reviewed: A1c=13.3% TSH=1.8  Lab Results  Component Value Date   CREATININE 0.61 (L) 03/31/2021   BUN 18 03/31/2021   NA 139 03/31/2021   K 4.5 03/31/2021   CL 100 03/31/2021   CO2 30 03/31/2021   A1c=6.2%  I have reviewed outside records, and summarized: Pt was noted to have elevated A1c, and referred here.  H was found to have 600, and sent to ER.  He was found to have hepatic abscess CT abdomen showed large hepatic abscess     Assessment & Plan:  Type 2 DM: well-controlled.   Lean body habitus.   Hepatic abscess.  Rx of this has prob improved glycemic control  Patient Instructions  good diet and exercise significantly improve the control of your diabetes.  please let me know if you wish to be referred to a dietician.  high blood sugar is very risky to your health.  you should see an eye doctor and dentist every year.  It is very important to get all recommended vaccinations.  Controlling your blood pressure and  cholesterol drastically reduces the damage diabetes does to your body.  Those who smoke should quit.  Please discuss these with your doctor.  check your blood sugar once a day.  vary the time of day when you check, between before the 3 meals, and at bedtime.  also check if you have symptoms of your blood sugar being too high or too low.  please keep a record of the readings and bring it to your next appointment here (or you can bring the meter itself).  You can write it on any piece of paper.  please call us sooner if your blood sugar goes below 70, or if most of your readings are over 200. We will need to take this complex situation in stages.  For now, I have sent 2 prescriptions to your pharmacy: to change glipizide to "Rybelsus," and to change metformin to extended-release. Please come back for a follow-up appointment in 2 weeks, at 4:30PM.  Nadara Mode dieta y ejercicio mejoran significativamente el control de su diabetes. por favor hgamelo saber si desea ser referido a un dietista. el nivel alto de azcar en la sangre es muy peligroso para su salud. usted debe ver a un Child psychotherapist y Occupational hygienist. Es muy importante obtener todas las vacunas recomendadas. Controlar la presin arterial y el colesterol reduce drsticamente el dao que la diabetes le causa a su cuerpo. Los que fuman deben dejar de Media planner. Hable de esto con su mdico. controle su nivel de azcar en la sangre una vez al da. Vare la hora del da en la que realiza la [place], entre antes de las 3 comidas y antes de [place]. tambin verifique si tiene sntomas de que su nivel de azcar en la sangre es demasiado alto o [place]. mantenga un registro de las lecturas y Nurse, adult a su prxima cita aqu (o puede traer Chief Executive Officer). Puedes escribirlo en cualquier hoja de papel. llmenos antes si su nivel de azcar en la sangre es inferior a 70 o si la mayora de sus lecturas superan los 200. Tendremos que abordar esta compleja situacin  por etapas. Por ahora, he enviado 2 recetas a su farmacia: para cambiar la glipizida a "Rybelsus" y para cambiar la metformina a liberacin prolongada. Vuelva para una cita de seguimiento en 2 semanas, a las 4:30 p. m.

## 2021-05-01 ENCOUNTER — Other Ambulatory Visit: Payer: Self-pay

## 2021-05-01 ENCOUNTER — Ambulatory Visit (INDEPENDENT_AMBULATORY_CARE_PROVIDER_SITE_OTHER): Payer: Managed Care, Other (non HMO) | Admitting: Endocrinology

## 2021-05-01 DIAGNOSIS — E119 Type 2 diabetes mellitus without complications: Secondary | ICD-10-CM | POA: Diagnosis not present

## 2021-05-01 NOTE — Patient Instructions (Addendum)
check your blood sugar once a day.  vary the time of day when you check, between before the 3 meals, and at bedtime.  also check if you have symptoms of your blood sugar being too high or too low.  please keep a record of the readings and bring it to your next appointment here (or you can bring the meter itself).  You can write it on any piece of paper.  please call us sooner if your blood sugar goes below 70, or if most of your readings are over 200. Please continue the same metformin.  Please come back for a follow-up appointment in 2 months.    controle su nivel de azcar en la sangre una vez al da. Vare la hora del da en la que realiza la [place], entre antes de las 3 comidas y antes de [place]. tambin verifique si tiene sntomas de que su nivel de azcar en la sangre es demasiado alto o [place]. mantenga un registro de las lecturas y Nurse, adult a su prxima cita aqu (o puede traer Chief Executive Officer). Puedes escribirlo en cualquier hoja de papel. llmenos antes si su nivel de azcar en la sangre es inferior a 70 o si la mayora de sus lecturas superan los 200. Contine con la misma metformina. Vuelva para una cita de seguimiento en 2 meses.

## 2021-05-01 NOTE — Progress Notes (Signed)
Subjective:    Patient ID: Maurice Hodges, male    DOB: [birth year], 60 y.o.   MRN: [id number]  HPI Pt returns for f/u of diabetes mellitus: DM type: 2 Dx'ed: 2014 Complications: CAD Therapy: metformin DKA: never Severe hypoglycemia: never Pancreatitis: never Pancreatic imaging: normal on 2022 CT SDOH: none Other: he has never been on insulin; lean body habitus suggests he is evolving type 1 Interval history: Pt says cbg's are approx 100.  pt states he feels well in general. Past Medical History:  Diagnosis Date   Diabetes mellitus without complication (HCC)     Past Surgical History:  Procedure Laterality Date   IR RADIOLOGIST EVAL & MGMT  02/21/2021   IR RADIOLOGIST EVAL & MGMT  03/07/2021   IR RADIOLOGIST EVAL & MGMT  03/14/2021    Social History   Socioeconomic History   Marital status: Married    Spouse name: Not on file   Number of children: Not on file   Years of education: Not on file   Highest education level: Not on file  Occupational History   Not on file  Tobacco Use   Smoking status: Never   Smokeless tobacco: Never  Substance and Sexual Activity   Alcohol use: Not Currently   Drug use: Never   Sexual activity: Not on file  Other Topics Concern   Not on file  Social History Narrative   Not on file   Social Determinants of Health   Financial Resource Strain: Not on file  Food Insecurity: Not on file  Transportation Needs: Not on file  Physical Activity: Not on file  Stress: Not on file  Social Connections: Not on file  Intimate Partner Violence: Not on file    Current Outpatient Medications on File Prior to Visit  Medication Sig Dispense Refill   atorvastatin (LIPITOR) 40 MG tablet Take 40 mg by mouth daily.     gabapentin (NEURONTIN) 300 MG capsule Take 1 capsule by mouth 3 (three) times daily.     metFORMIN (GLUCOPHAGE-XR) 500 MG 24 hr tablet Take 4 tablets (2,000 mg total) by mouth daily. 360 tablet 3   No current facility-administered  medications on file prior to visit.    No Known Allergies  Family History  Problem Relation Age of Onset   Diabetes Other    Heart attack Neg Hx    Stroke Neg Hx     BP 130/84 (BP Location: Right Arm, Patient Position: Sitting, Cuff Size: Normal)   Pulse 83   Ht 5\' 6"  (1.676 m)   Wt 148 lb (67.1 kg)   SpO2 96%   BMI 23.89 kg/m   Review of Systems He has regained a few lbs.      Objective:   Physical Exam VITAL SIGNS:  See vs page GENERAL: no distress Ext: no leg edema   Lab Results  Component Value Date   CREATININE 0.61 (L) 03/31/2021   BUN 18 03/31/2021   NA 139 03/31/2021   K 4.5 03/31/2021   CL 100 03/31/2021   CO2 30 03/31/2021       Assessment & Plan:  Type 2 DM: apparently well-controlled.  He declines to add Rybelsus.  Hepatic abscess: as he recovers, he may regain weight, and therefore need increased rx of DM.   Patient Instructions  check your blood sugar once a day.  vary the time of day when you check, between before the 3 meals, and at bedtime.  also check if you have symptoms of  your blood sugar being too high or too low.  please keep a record of the readings and bring it to your next appointment here (or you can bring the meter itself).  You can write it on any piece of paper.  please call us sooner if your blood sugar goes below 70, or if most of your readings are over 200. Please continue the same metformin.  Please come back for a follow-up appointment in 2 months.    controle su nivel de azcar en la sangre una vez al da. Vare la hora del da en la que realiza la [place], entre antes de las 3 comidas y antes de [place]. tambin verifique si tiene sntomas de que su nivel de azcar en la sangre es demasiado alto o [place]. mantenga un registro de las lecturas y Nurse, adult a su prxima cita aqu (o puede traer Chief Executive Officer). Puedes escribirlo en cualquier hoja de papel. llmenos antes si su nivel de azcar en la sangre es inferior a 70 o si  la mayora de sus lecturas superan los 200. Contine con la misma metformina. Vuelva para una cita de seguimiento en 2 meses.

## 2021-05-03 ENCOUNTER — Encounter: Payer: Self-pay | Admitting: Internal Medicine

## 2021-05-03 ENCOUNTER — Ambulatory Visit (INDEPENDENT_AMBULATORY_CARE_PROVIDER_SITE_OTHER): Payer: Managed Care, Other (non HMO) | Admitting: Internal Medicine

## 2021-05-03 ENCOUNTER — Other Ambulatory Visit: Payer: Self-pay

## 2021-05-03 VITALS — BP 155/97 | HR 77 | Temp 98.4°F | Wt 148.0 lb

## 2021-05-03 DIAGNOSIS — K750 Abscess of liver: Secondary | ICD-10-CM | POA: Diagnosis not present

## 2021-05-03 NOTE — Progress Notes (Signed)
[facility]  Patient Active Problem List   Diagnosis Date Noted   Diabetes (HCC) 05/01/2021   Hepatic abscess 03/31/2021   Intra-abdominal abscess (HCC)    Abdominal fluid collection    Central venous catheter in place    Bacterial liver abscess 02/01/2021   Hypokalemia 02/01/2021   AKI (acute kidney injury) (HCC) 02/01/2021   Severe sepsis (HCC) 02/01/2021   Normocytic anemia 02/01/2021   Liver abscess due to bacteria 02/01/2021      Subjective:    Patient ID: Maurice Hodges, male    DOB: 1961/04/27, 60 y.o.   MRN: [id number]  Chief Complaint  Patient presents with   Follow-up    Hepatic abscess    HPI:  60 y.o. male history of diabetes mellitus and liver abscess here for f/u    Background: From dr Daiva Eves 4/21 inpatient consult note: Sepsis for a month, seen at [facility] who diagnosed him with having strep throat based on a rapid strep antigen test.  Note the documentation in the note though Ebony Cargo states that his oropharynx was clear.  The patient self tell me that his tongue had developed a white coating he remembers.   He was prescribed Augmentin.   In the interim he worsened and came back to the urgent care on the 4/13 with severe abdominal discomfort.  He had quite elevated transaminases as well as a blood sugar greater than 600.  He was referred to the urgent care at [facility] to the emergency department.  In the ER he is initially hemodynamically stable but then became hypotensive.  He was given fluid boluses.   CT scan showed a large hepatic abscess.  He underwent percutaneous drainage on 15 April 12/04/2020:   Cultures in the abscess grew E. coli and Bacteroides fragilis.  The E. coli apparently is been sent to the [facility] which is quite baffling to me.   He had been on Zosyn there.   The patient himself tells me that the drain became shot due to a switch with locking system on his tubing that he showed  me.  When it was turned to shot of course it did not drain anymore and he became systemically ill with drenching sweats and fevers.   Apparently the following day one of the physicians noticed this and reopened his drain which immediately filled up with purulent material.   In the interim he was switched over to meropenem.   He has developed additional intra-abdominal abscesses and has been transferred to [facility] for further evaluation and management.   Interventional radiology of seeing the patient and placed 2 drain 1 into a loculated collection around the inferior right hepatic lobe which yielded 50 mL of cloudy fluid on aspiration and a second 1 in the lower abdomen that the clip collects with a pelvic collection of fluid with 325 mL aspirated and drain placed.   Cultures have been sent by radiology as well.   The patient himself denies ever having had trouble with his gallbladder.  He has never had appendicitis or diverticulitis.   He is eager to have water and also to eat.   He had a PICC line in place which is visible on the right arm.  He is sister comments that it was placed last Thursday.   He should have cleared his Bacteroides on Zosyn and that by that time.  ------------ 5/17 id clinic f/u Doing well The  ecoli never was able to be grown Improved weight Eating well 5/10 repeat abd ct improved abscess size No n/v/diarrhea/rash/abd pain IR to repeat ct in 1-2 weeks   7/20 id clinic f/u Patient saw dr Elinor Parkinson in clinic on 6/17. Doing well at that time. 5/24 ct abd pelv showed resolution of abscess. Advised to take abx 1 week after drain removed on 5/31 He has no complaint today and is happy with how things are going Eating well No abd pain No n/v/f/c Patient started working again 7/19; needs work excuse letter   No Known Allergies    Outpatient Medications Prior to Visit  Medication Sig Dispense Refill   atorvastatin (LIPITOR) 40 MG tablet Take 40  mg by mouth daily.     gabapentin (NEURONTIN) 300 MG capsule Take 1 capsule by mouth 3 (three) times daily.     metFORMIN (GLUCOPHAGE-XR) 500 MG 24 hr tablet Take 4 tablets (2,000 mg total) by mouth daily. 360 tablet 3   No facility-administered medications prior to visit.     Social History   Socioeconomic History   Marital status: Married    Spouse name: Not on file   Number of children: Not on file   Years of education: Not on file   Highest education level: Not on file  Occupational History   Not on file  Tobacco Use   Smoking status: Never   Smokeless tobacco: Never  Substance and Sexual Activity   Alcohol use: Not Currently   Drug use: Never   Sexual activity: Not on file  Other Topics Concern   Not on file  Social History Narrative   Not on file   Social Determinants of Health   Financial Resource Strain: Not on file  Food Insecurity: Not on file  Transportation Needs: Not on file  Physical Activity: Not on file  Stress: Not on file  Social Connections: Not on file  Intimate Partner Violence: Not on file      Review of Systems    All other ros negative    Objective:    BP (!) 155/97   Pulse 77   Temp 98.4 F (36.9 C) (Oral)   Wt 148 lb (67.1 kg)   SpO2 98%   BMI 23.89 kg/m  Nursing note and vital signs reviewed.  Physical Exam  General/constitutional: no distress, pleasant HEENT: Normocephalic, PER, Conj Clear, EOMI, Oropharynx clear Neck supple CV: rrr no mrg Lungs: clear to auscultation, normal respiratory effort Abd: Soft, Nontender Ext: no edema Skin: No Rash Neuro: nonfocal MSK: no peripheral joint swelling/tenderness/warmth; back spines nontender      Labs: Lab Results  Component Value Date   WBC 8.1 03/31/2021   HGB 14.9 03/31/2021   HCT 46.5 03/31/2021   MCV 85.8 03/31/2021   PLT 328 03/31/2021   Last metabolic panel Lab Results  Component Value Date   GLUCOSE 144 (H) 03/31/2021   NA 139 03/31/2021   K 4.5  03/31/2021   CL 100 03/31/2021   CO2 30 03/31/2021   BUN 18 03/31/2021   CREATININE 0.61 (L) 03/31/2021   GFRNONAA >60 02/08/2021   CALCIUM 10.2 03/31/2021   PHOS 3.1 02/05/2021   PROT 7.8 03/31/2021   ALBUMIN 1.2 (L) 02/04/2021   BILITOT 0.3 03/31/2021   ALKPHOS 81 02/04/2021   AST 17 03/31/2021   ALT 14 03/31/2021   ANIONGAP 8 02/08/2021      Component Value Date/Time   CRP 1.0 03/31/2021 1414     Micro: 4/21  ir drain abscess cx negative (while already on abx)  Serology:  Imaging: 5/10 abd pelv ct 1. Interval resolution of perihepatic right upper quadrant abscess. 2. Near complete resolution of midline pelvic abscess. 3. Interval decrease in size of right hepatic abscess with residual collection measuring 5.1 x 4.8 x 4.2 cm compared to 7.9 x 6.0 x 6.7 cm on prior CT.  5/24 abd/pelv ct 1. Interval resolution of the fluid component of the intrahepatic abscess with residual edema in the hepatic parenchyma. The drainage catheter remains well positioned. 2. Similar appearance of thrombosis of a branch of the middle hepatic vein, likely sequelae of infection. 3. Stable moderately large right pleural effusion without interval change. 4. Aortic Atherosclerosis   Assessment & Plan:   Problem List Items Addressed This Visit   None  CC: Hepatic abscess   Lines: 4/14-c rue triple lumen picc   Abx: 6/01-6/07 cefdinir 4/25-5/31 ceftriaxone 4/25-6/07 metronidazole   4/20-25 meropenem 4/13-20 piptazo   ASSESSMENT: -intraabd abscesses with severe sepsis -Polymicrobial hepatic abscesses initial management [facility]; s/p 4/15 perc drain liver abscess placement; cx ecoli/bacteroides -Bacteroides bacteremia 4/13 in setting liver abscess     60 yo male dm2 admitted 4/20 in transfer from [facility] with percutaneous drain valve shut off and feeling poorly including having fever and new intraabdominal abscesses   4/20 osh ct with improving hepatic abscess  but 2 new abscesses (1 is prerectal and 1 is subscapsular right hepatic lobe)   He had undergone here 2 more drains into the 2 other abscesses found on 4/20. The previously placed perc drain valve is opened and functioning now   The 4/21 drain cx is negative.    I agree the septic picture is due to ?accidentally shut off perc drain and source control rather than abx failure    Of note, no obvious stone/biliary obstruction found. Will need age appropriate cancer screening   -------- 4/26 assessment Continues to do well after switch from meropenem to ceftriaxone yesterday Outside hospital culture continues to not be able to grow the [facility] cx here negative   Clinically doing much better. Repeat ct 4/26 showed right hepatic fluid collection in continuity with previous but increased size ??? Loculation. Pending IR reevaluation   Reasonable to arrange opat and plan outpatient f/u with ID, once IR reevaluated  5/17 assessment Reviewed abd ct on 5/10 with him. Improved abscess size; jp drain functioning well draining 1 bulb a day. Clinically improving and tolerating abx   7/20 assessment Finished abx first week 03/2021. Had some nonspecific abd pain so back here for reevaluation Labs 03/31/2021 normal including crp No clinical sign of recurrent infection at this time    PLAN: Discharge from clinic Work excuse letter provided    Follow-up: No follow-ups on file.  Visit via language interpreter I have spent a total of 20 minutes of face-to-face and non-face-to-face time, excluding clinical staff time, preparing to see patient, ordering tests and/or medications, and provide counseling the patient       Raymondo Band, MD [facility] [phone]  pager   [phone] cell 05/03/2021, 2:30 PM

## 2021-05-03 NOTE — Patient Instructions (Signed)
You are doing well. There is no evidence of infection of your liver at this time   You don't need to see [place] anymore   Please ask our front desk staff for work excuse letter.

## 2021-07-10 ENCOUNTER — Other Ambulatory Visit: Payer: Self-pay

## 2021-07-10 ENCOUNTER — Ambulatory Visit (INDEPENDENT_AMBULATORY_CARE_PROVIDER_SITE_OTHER): Payer: Managed Care, Other (non HMO) | Admitting: Endocrinology

## 2021-07-10 VITALS — BP 180/92 | HR 61 | Ht 66.0 in | Wt 150.6 lb

## 2021-07-10 DIAGNOSIS — E119 Type 2 diabetes mellitus without complications: Secondary | ICD-10-CM

## 2021-07-10 LAB — POCT GLYCOSYLATED HEMOGLOBIN (HGB A1C): Hemoglobin A1C: 7.5 % — AB (ref 4.0–5.6)

## 2021-07-10 MED ORDER — RYBELSUS 3 MG PO TABS: 3.0000 mg | ORAL_TABLET | Freq: Every day | ORAL | 11 refills | Status: DC

## 2021-07-10 NOTE — Patient Instructions (Addendum)
Your blood pressure is high today.  Please see your primary care provider soon, to have it rechecked.   check your blood sugar once a day.  vary the time of day when you check, between before the 3 meals, and at bedtime.  also check if you have symptoms of your blood sugar being too high or too low.  please keep a record of the readings and bring it to your next appointment here (or you can bring the meter itself).  You can write it on any piece of paper.  please call us sooner if your blood sugar goes below 70, or if most of your readings are over 200. I have sent a prescription to your pharmacy, to add Rybelsus.   Please continue the same metformin.  Please come back for a follow-up appointment in 2 months.      Su presin arterial es alta hoy. Consulte a su proveedor de atencin primaria pronto para que lo revise nuevamente. controle su nivel de azcar en la sangre una vez al da. Vare la hora del da en la que realiza la [place], entre antes de las 3 comidas y antes de [place]. tambin verifique si tiene sntomas de que su nivel de azcar en la sangre es demasiado alto o [place]. mantenga un registro de las lecturas y Nurse, adult a su prxima cita aqu (o puede traer Chief Executive Officer). Puedes escribirlo en cualquier hoja de papel. llmenos antes si su nivel de azcar en la sangre es inferior a 70 o si la mayora de sus lecturas superan los 200. He enviado una receta a su farmacia para agregar Rybelsus. Contine con la misma metformina. Vuelva para una cita de seguimiento en 2 meses.

## 2021-07-10 NOTE — Progress Notes (Signed)
Subjective:    Patient ID: Maurice Hodges, male    DOB: [birth year], 60 y.o.   MRN: [id number]  HPI Pt returns for f/u of diabetes mellitus: DM type: 2 Dx'ed: 2014 Complications: CAD Therapy: metformin DKA: never Severe hypoglycemia: never Pancreatitis: never Pancreatic imaging: normal on 2022 CT.   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   Interval history: Pt says cbg's are in the mid- 100's.  pt states he feels well in general.  He takes meds as rx'ed.   Past Medical History:  Diagnosis Date   Diabetes mellitus without complication [facility])     Past Surgical History:  Procedure Laterality Date   IR RADIOLOGIST EVAL & MGMT  02/21/2021   IR RADIOLOGIST EVAL & MGMT  03/07/2021   IR RADIOLOGIST EVAL & MGMT  03/14/2021    Social History   Socioeconomic History   Marital status: Married    Spouse name: Not on file   Number of children: Not on file   Years of education: Not on file   Highest education level: Not on file  Occupational History   Not on file  Tobacco Use   Smoking status: Never   Smokeless tobacco: Never  Substance and Sexual Activity   Alcohol use: Not Currently   Drug use: Never   Sexual activity: Not on file  Other Topics Concern   Not on file  Social History Narrative   Not on file   Social Determinants of Health   Financial Resource Strain: Not on file  Food Insecurity: Not on file  Transportation Needs: Not on file  Physical Activity: Not on file  Stress: Not on file  Social Connections: Not on file  Intimate Partner Violence: Not on file    Current Outpatient Medications on File Prior to Visit  Medication Sig Dispense Refill   atorvastatin (LIPITOR) 40 MG tablet Take 40 mg by mouth daily.     metFORMIN (GLUCOPHAGE-XR) 500 MG 24 hr tablet Take 4 tablets (2,000 mg total) by mouth daily. 360 tablet 3   No current facility-administered medications on file prior to visit.    No Known  Allergies  Family History  Problem Relation Age of Onset   Diabetes Other    Heart attack Neg Hx    Stroke Neg Hx     BP (!) 180/92 (BP Location: Right Arm, Patient Position: Sitting, Cuff Size: Normal)   Pulse 61   Ht 5\' 6"  (1.676 m)   Wt 150 lb 9.6 oz (68.3 kg)   SpO2 98%   BMI 24.31 kg/m    Review of Systems     Objective:   Physical Exam Pulses: dorsalis pedis intact bilat.   MSK: no deformity of the feet CV: no leg edema Skin:  no ulcer on the feet.  normal color and temp on the feet. Neuro: sensation is intact to touch on the feet.     Lab Results  Component Value Date   HGBA1C 7.5 (A) 07/10/2021      Assessment & Plan:  Type 2 DM: uncontrolled.    Patient Instructions  Your blood pressure is high today.  Please see your primary care provider soon, to have it rechecked.   check your blood sugar once a day.  vary the time of day when you check, between before the 3 meals, and at bedtime.  also check if you have symptoms of your blood sugar being too high  or too low.  please keep a record of the readings and bring it to your next appointment here (or you can bring the meter itself).  You can write it on any piece of paper.  please call us sooner if your blood sugar goes below 70, or if most of your readings are over 200. I have sent a prescription to your pharmacy, to add Rybelsus.   Please continue the same metformin.  Please come back for a follow-up appointment in 2 months.      Su presin arterial es alta hoy. Consulte a su proveedor de atencin primaria pronto para que lo revise nuevamente. controle su nivel de azcar en la sangre una vez al da. Vare la hora del da en la que realiza la [place], entre antes de las 3 comidas y antes de [place]. tambin verifique si tiene sntomas de que su nivel de azcar en la sangre es demasiado alto o [place]. mantenga un registro de las lecturas y Nurse, adult a su prxima cita aqu (o puede traer Chief Executive Officer).  Puedes escribirlo en cualquier hoja de papel. llmenos antes si su nivel de azcar en la sangre es inferior a 70 o si la mayora de sus lecturas superan los 200. He enviado una receta a su farmacia para agregar Rybelsus. Contine con la misma metformina. Vuelva para una cita de seguimiento en 2 meses.

## 2021-09-18 ENCOUNTER — Ambulatory Visit (INDEPENDENT_AMBULATORY_CARE_PROVIDER_SITE_OTHER): Payer: Managed Care, Other (non HMO) | Admitting: Endocrinology

## 2021-09-18 ENCOUNTER — Other Ambulatory Visit: Payer: Self-pay

## 2021-09-18 VITALS — BP 180/100 | HR 70 | Ht 66.0 in | Wt 153.4 lb

## 2021-09-18 DIAGNOSIS — E119 Type 2 diabetes mellitus without complications: Secondary | ICD-10-CM

## 2021-09-18 LAB — POCT GLYCOSYLATED HEMOGLOBIN (HGB A1C): Hemoglobin A1C: 7.1 % — AB (ref 4.0–5.6)

## 2021-09-18 MED ORDER — RYBELSUS 7 MG PO TABS: 7.0000 mg | ORAL_TABLET | Freq: Every day | ORAL | 3 refills | Status: AC

## 2021-09-18 NOTE — Patient Instructions (Addendum)
Your blood pressure is high today.  Please see your primary care provider soon, to have it rechecked.   check your blood sugar once a day.  vary the time of day when you check, between before the 3 meals, and at bedtime.  also check if you have symptoms of your blood sugar being too high or too low.  please keep a record of the readings and bring it to your next appointment here (or you can bring the meter itself).  You can write it on any piece of paper.  please call us sooner if your blood sugar goes below 70, or if most of your readings are over 200.   I have sent a prescription to your pharmacy, to increase the Rybelsus.   Please continue the same metformin.  Please come back for a follow-up appointment in 3 months.     Su presin arterial es alta hoy. Consulte a su proveedor de atencin primaria pronto para que lo revise nuevamente. controle su nivel de azcar en la sangre una vez al da. Vare la hora del da en la que realiza la [place], entre antes de las 3 comidas y antes de [place]. tambin verifique si tiene sntomas de que su nivel de azcar en la sangre es demasiado alto o [place]. mantenga un registro de las lecturas y Nurse, adult a su prxima cita aqu (o puede traer Chief Executive Officer). Puedes escribirlo en cualquier hoja de papel. llmenos antes si su nivel de azcar en la sangre es inferior a 70 o si la mayora de sus lecturas superan los 200. He enviado una receta a su farmacia para aumentar el Rybelsus. Contine con la misma metformina. Vuelva para una cita de seguimiento en 3 meses.

## 2021-09-18 NOTE — Progress Notes (Signed)
Subjective:    Patient ID: Maurice Hodges, male    DOB: September 12, 1961, 60 y.o.   MRN: [id number]  HPI Pt returns for f/u of diabetes mellitus: DM type: 2 Dx'ed: 2014 Complications: CAD Therapy: metformin DKA: never Severe hypoglycemia: never Pancreatitis: never Pancreatic imaging: normal on 2022 CT.   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   Interval history: Pt says cbg's are in the low-100's.  pt states he feels well in general.  He takes meds as rx'ed.   Past Medical History:  Diagnosis Date   Diabetes mellitus without complication [facility])     Past Surgical History:  Procedure Laterality Date   IR RADIOLOGIST EVAL & MGMT  02/21/2021   IR RADIOLOGIST EVAL & MGMT  03/07/2021   IR RADIOLOGIST EVAL & MGMT  03/14/2021    Social History   Socioeconomic History   Marital status: Married    Spouse name: Not on file   Number of children: Not on file   Years of education: Not on file   Highest education level: Not on file  Occupational History   Not on file  Tobacco Use   Smoking status: Never   Smokeless tobacco: Never  Substance and Sexual Activity   Alcohol use: Not Currently   Drug use: Never   Sexual activity: Not on file  Other Topics Concern   Not on file  Social History Narrative   Not on file   Social Determinants of Health   Financial Resource Strain: Not on file  Food Insecurity: Not on file  Transportation Needs: Not on file  Physical Activity: Not on file  Stress: Not on file  Social Connections: Not on file  Intimate Partner Violence: Not on file    Current Outpatient Medications on File Prior to Visit  Medication Sig Dispense Refill   atorvastatin (LIPITOR) 40 MG tablet Take 40 mg by mouth daily.     metFORMIN (GLUCOPHAGE-XR) 500 MG 24 hr tablet Take 4 tablets (2,000 mg total) by mouth daily. 360 tablet 3   No current facility-administered medications on file prior to visit.    No Known  Allergies  Family History  Problem Relation Age of Onset   Diabetes Other    Heart attack Neg Hx    Stroke Neg Hx     BP (!) 180/100   Pulse 70   Ht 5\' 6"  (1.676 m)   Wt 153 lb 6.4 oz (69.6 kg)   SpO2 97%   BMI 24.76 kg/m    Review of Systems Denies N/HB    Objective:   Physical Exam  Lab Results  Component Value Date   CREATININE 0.61 (L) 03/31/2021   BUN 18 03/31/2021   NA 139 03/31/2021   K 4.5 03/31/2021   CL 100 03/31/2021   CO2 30 03/31/2021    Lab Results  Component Value Date   HGBA1C 7.1 (A) 09/18/2021      Assessment & Plan:  Type 2 DM: uncontrolled.    Patient Instructions  Your blood pressure is high today.  Please see your primary care provider soon, to have it rechecked.   check your blood sugar once a day.  vary the time of day when you check, between before the 3 meals, and at bedtime.  also check if you have symptoms of your blood sugar being too high or too low.  please keep a record of the readings and bring it  to your next appointment here (or you can bring the meter itself).  You can write it on any piece of paper.  please call us sooner if your blood sugar goes below 70, or if most of your readings are over 200.   I have sent a prescription to your pharmacy, to increase the Rybelsus.   Please continue the same metformin.  Please come back for a follow-up appointment in 3 months.     Su presin arterial es alta hoy. Consulte a su proveedor de atencin primaria pronto para que lo revise nuevamente. controle su nivel de azcar en la sangre una vez al da. Vare la hora del da en la que realiza la [place], entre antes de las 3 comidas y antes de [place]. tambin verifique si tiene sntomas de que su nivel de azcar en la sangre es demasiado alto o [place]. mantenga un registro de las lecturas y Nurse, adult a su prxima cita aqu (o puede traer Chief Executive Officer). Puedes escribirlo en cualquier hoja de papel. llmenos antes si su nivel de azcar en  la sangre es inferior a 70 o si la mayora de sus lecturas superan los 200. He enviado una receta a su farmacia para aumentar el Rybelsus. Contine con la misma metformina. Vuelva para una cita de seguimiento en 3 meses.

## 2021-12-18 ENCOUNTER — Ambulatory Visit: Payer: Managed Care, Other (non HMO) | Admitting: Endocrinology
